# Patient Record
Sex: Female | Born: 1975 | Race: Black or African American | Hispanic: No | Marital: Married | Smoking: Never smoker
Health system: Southern US, Community
[De-identification: ages and names within clinical notes are randomized; demographics above are authoritative.]

## PROBLEM LIST (undated history)

## (undated) DIAGNOSIS — I10 Essential (primary) hypertension: Secondary | ICD-10-CM

## (undated) HISTORY — PX: TUBAL LIGATION: SHX77

---

## 2015-06-05 DIAGNOSIS — I10 Essential (primary) hypertension: Secondary | ICD-10-CM | POA: Insufficient documentation

## 2015-09-21 DIAGNOSIS — G5603 Carpal tunnel syndrome, bilateral upper limbs: Secondary | ICD-10-CM | POA: Insufficient documentation

## 2016-10-19 ENCOUNTER — Emergency Department (HOSPITAL_COMMUNITY): Payer: BC Managed Care – PPO

## 2016-10-19 ENCOUNTER — Encounter (HOSPITAL_COMMUNITY): Payer: Self-pay | Admitting: Emergency Medicine

## 2016-10-19 ENCOUNTER — Emergency Department (HOSPITAL_COMMUNITY)
Admission: EM | Admit: 2016-10-19 | Discharge: 2016-10-19 | Disposition: A | Discharge status: Home / Self Care | Payer: BC Managed Care – PPO | Attending: Physician Assistant | Admitting: Physician Assistant

## 2016-10-19 DIAGNOSIS — I10 Essential (primary) hypertension: Secondary | ICD-10-CM | POA: Insufficient documentation

## 2016-10-19 DIAGNOSIS — S80212A Abrasion, left knee, initial encounter: Secondary | ICD-10-CM | POA: Insufficient documentation

## 2016-10-19 DIAGNOSIS — M25562 Pain in left knee: Secondary | ICD-10-CM

## 2016-10-19 DIAGNOSIS — S80211A Abrasion, right knee, initial encounter: Secondary | ICD-10-CM | POA: Insufficient documentation

## 2016-10-19 DIAGNOSIS — W19XXXA Unspecified fall, initial encounter: Secondary | ICD-10-CM

## 2016-10-19 DIAGNOSIS — Y929 Unspecified place or not applicable: Secondary | ICD-10-CM | POA: Insufficient documentation

## 2016-10-19 DIAGNOSIS — W108XXA Fall (on) (from) other stairs and steps, initial encounter: Secondary | ICD-10-CM | POA: Diagnosis not present

## 2016-10-19 DIAGNOSIS — Y999 Unspecified external cause status: Secondary | ICD-10-CM | POA: Insufficient documentation

## 2016-10-19 DIAGNOSIS — Y939 Activity, unspecified: Secondary | ICD-10-CM | POA: Insufficient documentation

## 2016-10-19 DIAGNOSIS — M25561 Pain in right knee: Secondary | ICD-10-CM

## 2016-10-19 DIAGNOSIS — S8991XA Unspecified injury of right lower leg, initial encounter: Secondary | ICD-10-CM | POA: Diagnosis present

## 2016-10-19 HISTORY — DX: Essential (primary) hypertension: I10

## 2016-10-19 NOTE — ED Provider Notes (Signed)
[facility]-EMERGENCY DEPT Provider Note   CSN: [id number] Arrival date & time: 10/19/16  1216  By signing my name below, I, Rosario AdieWilliam Andrew Hiatt, attest that this documentation has been prepared under the direction and in the presence of [facility] HospitalEmily Kharma Sampsel, PA-C.  Electronically Signed: Rosario AdieWilliam Andrew Hiatt, ED Scribe. 10/19/16. 1:45 PM.  History   Chief Complaint Chief Complaint  Patient presents with  . Fall  . Knee Pain   The history is provided by the patient. No language interpreter was used.    HPI Comments: Kaylee May is a 41 y.o. female with a h/o HTN, who presents to the Emergency Department complaining of sudden onset, persistent bilateral knee pain beginning prior to arrival. Per pt, she was walking up a set of concrete steps when she suddenly fell into a kneeling position without tripping. She denies chest pain, shortness of breath, syncope, light-headedness, dizziness, or back pain preceding her fall. No LOC or head injury. She reports that she laid down on the ground until police and EMS arrived d/t her pain, and she denies any numbness/weakness. Pt has been ambulatory since this incident but she reports that this has been somewhat difficult secondary to this worsening her pain. She also sustained small abrasions overlying the bilateral knees. No noted treatments for her pain were tried prior to coming into the ED. No other associated symptoms or complaints at this time. Tetanus is UTD.   Past Medical History:  Diagnosis Date  . Hypertension    There are no active problems to display for this patient.  Past Surgical History:  Procedure Laterality Date  . TUBAL LIGATION     OB History    No data available     Home Medications    Prior to Admission medications   Not on File   Family History No family history on file.  Social History Social History  Substance Use Topics  . Smoking status: Never Smoker  . Smokeless tobacco: Not on file  . Alcohol use No   Allergies     Patient has no known allergies.  Review of Systems Review of Systems  Respiratory: Negative for shortness of breath.   Cardiovascular: Negative for chest pain.  Musculoskeletal: Positive for arthralgias and myalgias. Negative for back pain.  Skin: Positive for wound.  Neurological: Negative for dizziness, syncope, weakness, light-headedness and numbness.   Physical Exam Updated Vital Signs BP (!) 128/91   Pulse 73   Temp 98.4 F (36.9 C) (Oral)   Resp 14   SpO2 98%   Physical Exam  Constitutional: She appears well-developed and well-nourished. No distress.  HENT:  Head: Normocephalic and atraumatic.  Neck: Neck supple.  Pulmonary/Chest: Effort normal.  Musculoskeletal: She exhibits tenderness.  Some superficial abrasions to the bilateral knees with tenderness overlying abrasions and proximal tibia bilaterally. Compartments are soft. No other focal bony tenderness throughout lower legs. Spine nontender, no crepitus, or stepoffs. Lower extremities:  Strength 5/5, sensation intact, distal pulses intact.     Neurological: She is alert.  Skin: She is not diaphoretic.  Nursing note and vitals reviewed.  ED Treatments / Results  DIAGNOSTIC STUDIES: Oxygen Saturation is 100% on RA, normal by my interpretation.   COORDINATION OF CARE: 1:44 PM-Discussed next steps with pt. Pt verbalized understanding and is agreeable with the plan.   Labs (all labs ordered are listed, but only abnormal results are displayed) Labs Reviewed - No data to display  EKG  EKG Interpretation None      Radiology  Dg Knee Complete 4 Views Left  Result Date: 10/19/2016 CLINICAL DATA:  Pain following fall EXAM: LEFT KNEE - COMPLETE 4+ VIEW COMPARISON:  None. FINDINGS: Frontal, lateral, and bilateral oblique views were obtained. There is no fracture or dislocation. No joint effusion. Joint spaces appear normal. No erosive change. IMPRESSION: No fracture or dislocation. No joint effusion. No appreciable  arthropathy. Electronically Signed   By: Bretta Bang III M.D.   On: 10/19/2016 14:17   Dg Knee Complete 4 Views Right  Result Date: 10/19/2016 CLINICAL DATA:  Larey Seat on concrete this morning. Bilateral anterior knee pain. Initial encounter. EXAM: RIGHT KNEE - COMPLETE 4+ VIEW COMPARISON:  None. FINDINGS: No evidence of fracture, dislocation, or joint effusion. No evidence of arthropathy or other focal bone abnormality. Soft tissues are unremarkable. IMPRESSION: Negative. Electronically Signed   By: Sebastian Ache M.D.   On: 10/19/2016 14:17    Procedures Procedures  Medications Ordered in ED Medications - No data to display  Initial Impression / Assessment and Plan / ED Course  I have reviewed the triage vital signs and the nursing notes.  Pertinent labs & imaging results that were available during my care of the patient were reviewed by me and considered in my medical decision making (see chart for details).     Afebrile, nontoxic patient with bilateral knee contusions after falling on the stairs walking into her job.  No current neurologic deficits.  No syncope.   D/C home with bilateral knee sleeves.  Pt declines crutches, medications.  Discussed result, findings, treatment, and follow up  with patient.  Pt given return precautions.  Pt verbalizes understanding and agrees with plan.       Final Clinical Impressions(s) / ED Diagnoses   Final diagnoses:  Acute pain of both knees  Fall, initial encounter   New Prescriptions There are no discharge medications for this patient.  I personally performed the services described in this documentation, which was scribed in my presence. The recorded information has been reviewed and is accurate.     Trixie Dredge, PA-C 10/19/16 1627    Abelino Derrick, MD 10/20/16 [phone]

## 2016-10-19 NOTE — Discharge Instructions (Signed)
Read the information below.  You may return to the Emergency Department at any time for worsening condition or any new symptoms that concern you.   If you develop uncontrolled pain, weakness or numbness of the extremity, severe discoloration of the skin, or you are unable to walk or move your knees, return to the ER for a recheck.

## 2016-10-19 NOTE — ED Triage Notes (Signed)
Pt c/o bilateral knee pain onset today after mechanical fall and landing on knees today. No head injury or LOC.

## 2017-11-26 IMAGING — CR DG KNEE COMPLETE 4+V*L*
4 series · 4 of 4 positions shown · non-contrast
Comparison: None.

CLINICAL DATA: Pain following fall

EXAM:
LEFT KNEE - COMPLETE 4+ VIEW

[t knee ap left]
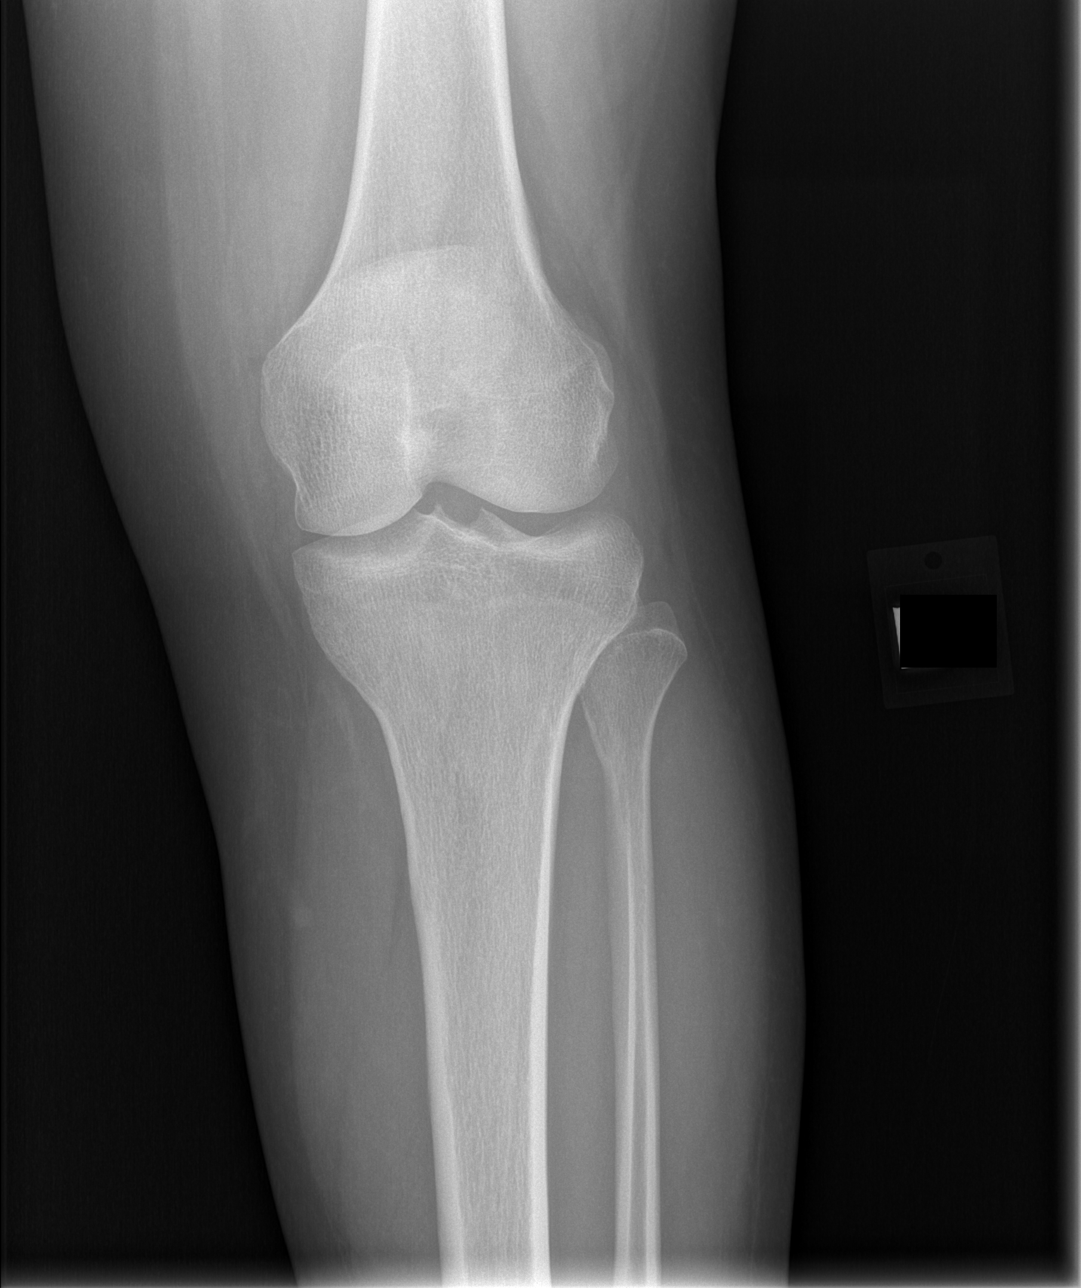

[t knee obl left (1 of 2)]
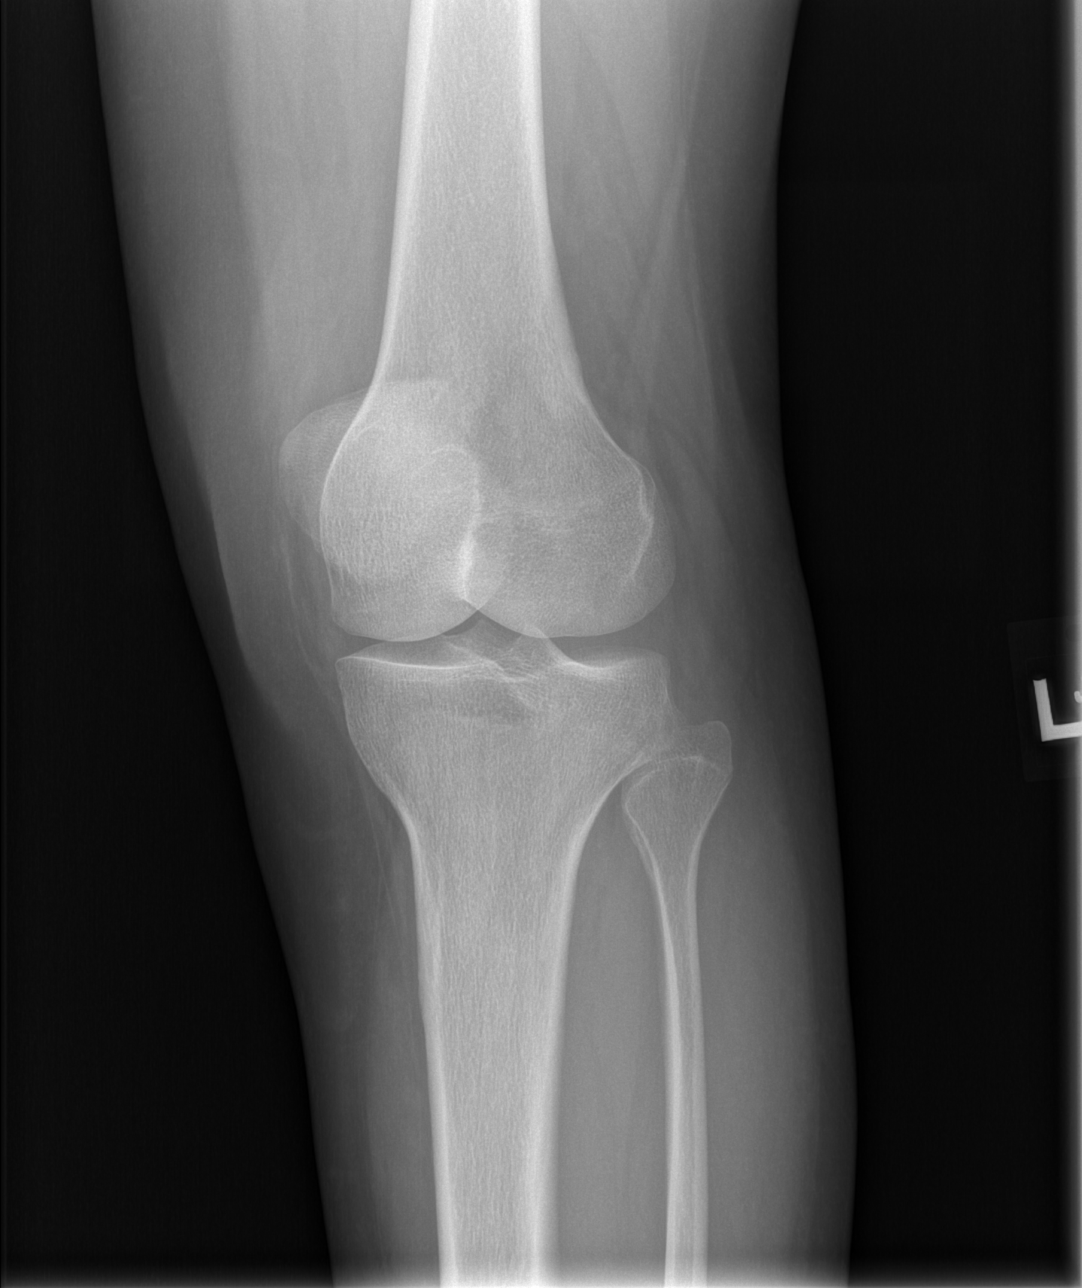

[t knee obl left (2 of 2)]
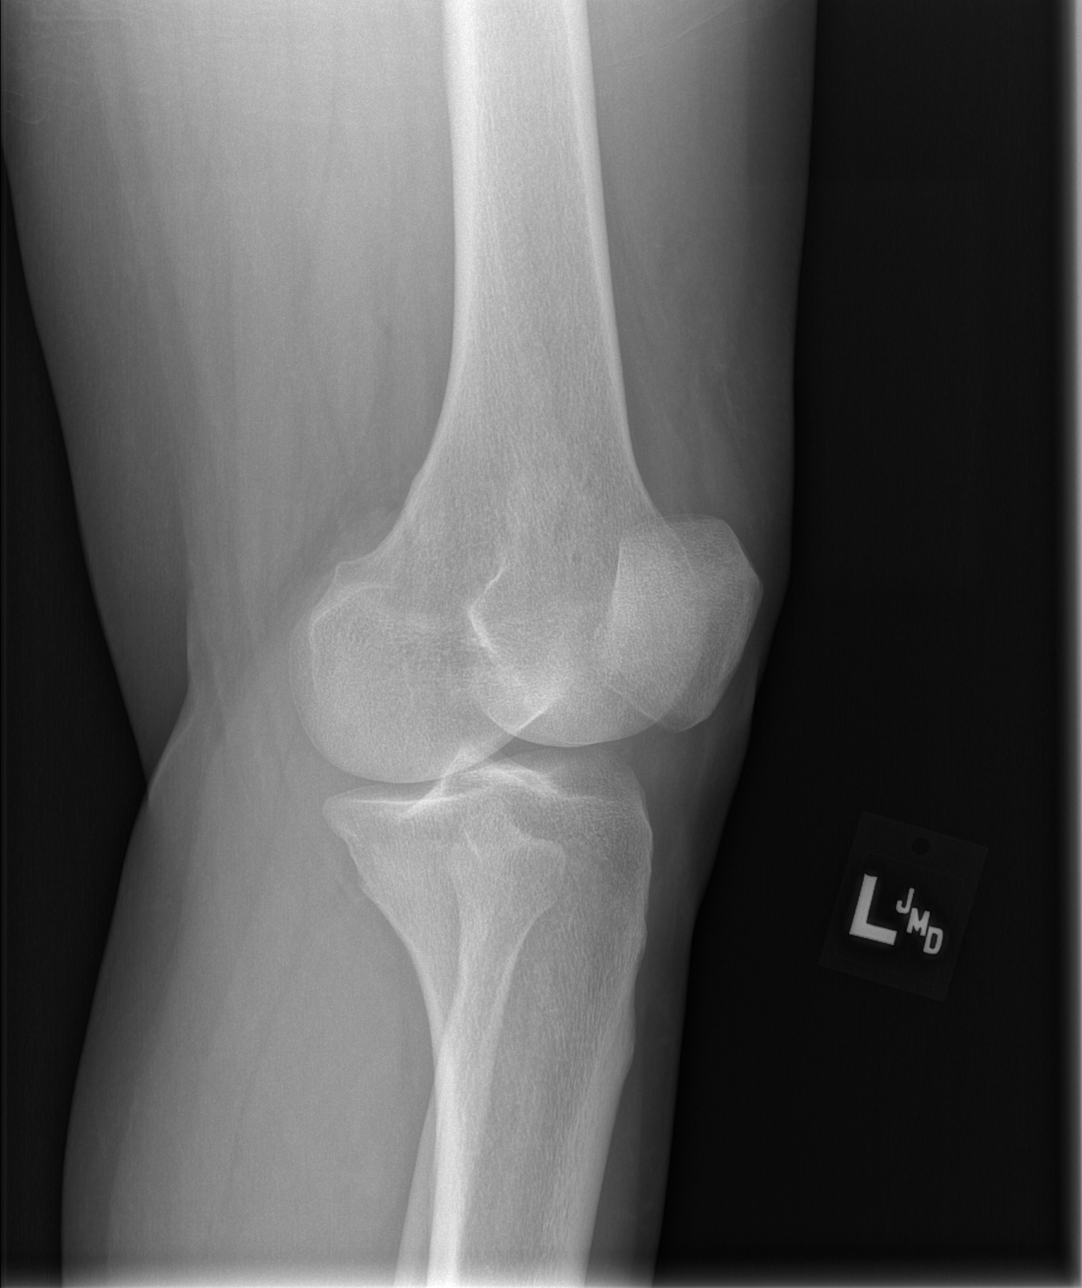

[t knee lat left]
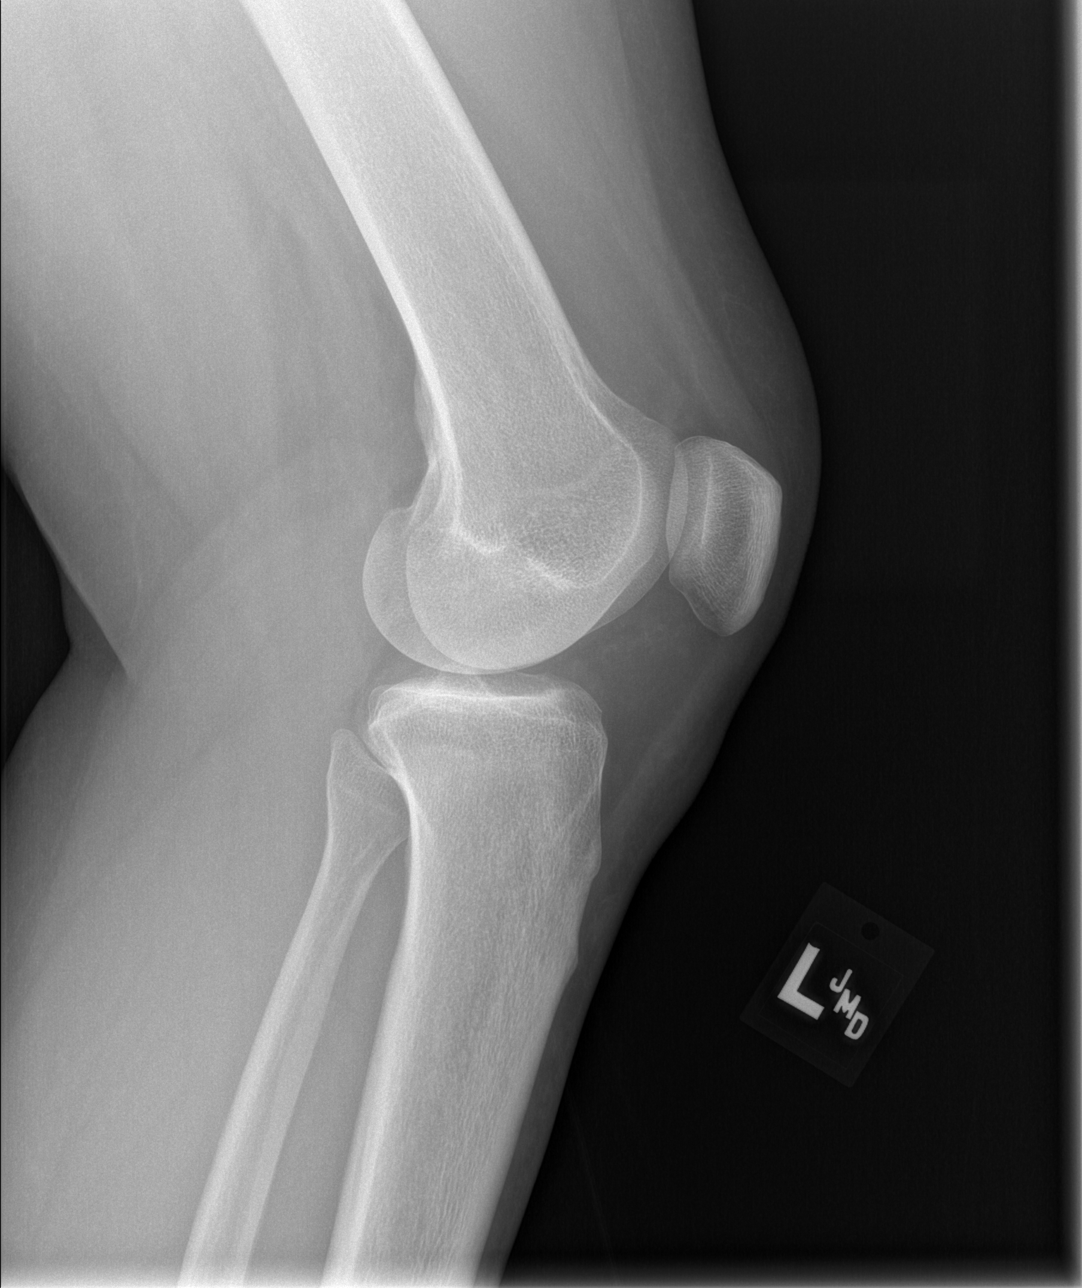

[4 of 4 positions shown; findings below may reference images not displayed]

FINDINGS: Frontal, lateral, and bilateral oblique views were obtained. There
is no fracture or dislocation. No joint effusion. Joint spaces
appear normal. No erosive change.
IMPRESSION: No fracture or dislocation. No joint effusion. No appreciable
arthropathy.

## 2020-05-28 ENCOUNTER — Ambulatory Visit: Payer: BC Managed Care – PPO | Attending: Internal Medicine

## 2020-05-28 DIAGNOSIS — Z23 Encounter for immunization: Secondary | ICD-10-CM

## 2020-05-28 NOTE — Progress Notes (Signed)
   Covid-19 Vaccination Clinic  Name:  Ayline Dingus    MRN: [id number] DOB: 1975/08/29  05/28/2020  Ms. Landen was observed post Covid-19 immunization for 15 minutes without incident. She was provided with Vaccine Information Sheet and instruction to access the V-Safe system.   Ms. Mette was instructed to call 911 with any severe reactions post vaccine: Marland Kitchen Difficulty breathing  . Swelling of face and throat  . A fast heartbeat  . A bad rash all over body  . Dizziness and weakness   Immunizations Administered    Name Date Dose VIS Date Route   Moderna Covid-19 Booster Vaccine 05/28/2020  5:47 PM 0.25 mL 04/01/2020 Intramuscular   Manufacturer: Moderna   Lot: [id number]   NDC: 33545-625-63

## 2021-02-17 ENCOUNTER — Ambulatory Visit: Payer: BC Managed Care – PPO | Admitting: Internal Medicine

## 2021-03-04 NOTE — Progress Notes (Signed)
Subjective:    Patient ID: Kaylee May, female    DOB: Feb 18, 1976, 45 y.o.   MRN: [id number]   This visit occurred during the SARS-CoV-2 public health emergency.  Safety protocols were in place, including screening questions prior to the visit, additional usage of staff PPE, and extensive cleaning of exam room while observing appropriate contact time as indicated for disinfecting solutions.    HPI She is here to establish with a new pcp.  She is here for a physical exam.   She has no concerns.     Medications and allergies reviewed with patient and updated if appropriate.  Patient Active Problem List   Diagnosis Date Noted   Carpal tunnel syndrome, bilateral 09/21/2015   Essential hypertension 06/05/2015    Current Outpatient Medications on File Prior to Visit  Medication Sig Dispense Refill   amLODipine (NORVASC) 10 MG tablet Take 10 mg by mouth daily.     triamterene-hydrochlorothiazide (DYAZIDE) 37.5-25 MG capsule Take 1 capsule by mouth daily.     No current facility-administered medications on file prior to visit.    Past Medical History:  Diagnosis Date   Hypertension     Past Surgical History:  Procedure Laterality Date   TUBAL LIGATION      Social History   Socioeconomic History   Marital status: Married    Spouse name: Not on file   Number of children: Not on file   Years of education: Not on file   Highest education level: Not on file  Occupational History   Not on file  Tobacco Use   Smoking status: Never   Smokeless tobacco: Never  Substance and Sexual Activity   Alcohol use: No   Drug use: No   Sexual activity: Not on file  Other Topics Concern   Not on file  Social History Narrative   Not on file   Social Determinants of Health   Financial Resource Strain: Not on file  Food Insecurity: Not on file  Transportation Needs: Not on file  Physical Activity: Not on file  Stress: Not on file  Social Connections: Not on file     Family History  Problem Relation Age of Onset   Hypertension Mother    Hypertension Father    Hypertension Sister    Hypertension Brother     Review of Systems  Constitutional:  Negative for chills and fever.  Eyes:  Negative for visual disturbance.  Respiratory:  Negative for cough, shortness of breath and wheezing.   Cardiovascular:  Positive for leg swelling (minimal occ). Negative for chest pain and palpitations.  Gastrointestinal:  Negative for abdominal pain, blood in stool, constipation, diarrhea and nausea.       No gerd  Genitourinary:  Negative for dysuria and hematuria.  Musculoskeletal:  Positive for back pain (occ - when she drinks soda). Negative for arthralgias.  Skin:  Negative for color change and rash.  Neurological:  Positive for headaches (occ - menstraul, with contacts). Negative for dizziness and light-headedness.  Psychiatric/Behavioral:  Negative for dysphoric mood. The patient is not nervous/anxious.       Objective:   Vitals:   03/05/21 1259  BP: 132/78  Pulse: 87  Temp: 98.4 F (36.9 C)  SpO2: 99%   Filed Weights   03/05/21 1259  Weight: 190 lb (86.2 kg)   Body mass index is 31.62 kg/m.  BP Readings from Last 3 Encounters:  03/05/21 132/78  10/19/16 (!) 128/91    Wt Readings from  Last 3 Encounters:  03/05/21 190 lb (86.2 kg)    Depression screen [facility] 2/9 03/05/2021  Decreased Interest 0  Down, Depressed, Hopeless 0  PHQ - 2 Score 0  Altered sleeping 0  Tired, decreased energy 0  Change in appetite 0  Feeling bad or failure about yourself  0  Trouble concentrating 0  Moving slowly or fidgety/restless 0  Suicidal thoughts 0  PHQ-9 Score 0    GAD 7 : Generalized Anxiety Score 03/05/2021  Nervous, Anxious, on Edge 0  Control/stop worrying 0  Worry too much - different things 0  Trouble relaxing 0  Restless 0  Easily annoyed or irritable 0  Afraid - awful might happen 0  Total GAD 7 Score 0       Physical  Exam Constitutional: She appears well-developed and well-nourished. No distress.  HENT:  Head: Normocephalic and atraumatic.  Right Ear: External ear normal. Normal ear canal and TM Left Ear: External ear normal.  Normal ear canal and TM Mouth/Throat: Oropharynx is clear and moist.  Eyes: Conjunctivae and EOM are normal.  Neck: Neck supple. No tracheal deviation present. No thyromegaly present.  No carotid bruit  Cardiovascular: Normal rate, regular rhythm and normal heart sounds.   No murmur heard.  No edema. Pulmonary/Chest: Effort normal and breath sounds normal. No respiratory distress. She has no wheezes. She has no rales.  Breast: deferred   Abdominal: Soft. She exhibits no distension. There is no tenderness.  Lymphadenopathy: She has no cervical adenopathy.  Skin: Skin is warm and dry. She is not diaphoretic.  Psychiatric: She has a normal mood and affect. Her behavior is normal.     No results found for: WBC, HGB, HCT, PLT, GLUCOSE, CHOL, TRIG, HDL, LDLDIRECT, LDLCALC, ALT, AST, NA, K, CL, CREATININE, BUN, CO2, TSH, PSA, INR, GLUF, HGBA1C, MICROALBUR       Assessment & Plan:   Physical exam: Screening blood work  ordered Exercise  does a lot of walking Weight  ok Substance abuse  none   Screened for depression using the PHQ 9 scale.  No evidence of depression.   Screened for anxiety using GAD7 Scale.  No evidence of anxiety.   Reviewed recommended immunizations.   Health Maintenance  Topic Date Due   MAMMOGRAM  Never done   PAP SMEAR-Modifier  Never done   COLONOSCOPY (Pts 45-25yrs Insurance coverage will need to be confirmed)  Never done   COVID-19 Vaccine (2 - Moderna series) 06/25/2020   INFLUENZA VACCINE  09/10/2021 (Originally 01/11/2021)   TETANUS/TDAP  06/04/2025   HPV VACCINES  Aged Out   Hepatitis C Screening  Discontinued   HIV Screening  Discontinued          See Problem List for Assessment and Plan of chronic medical problems.

## 2021-03-05 ENCOUNTER — Other Ambulatory Visit: Payer: Self-pay

## 2021-03-05 ENCOUNTER — Ambulatory Visit: Payer: Self-pay | Admitting: Internal Medicine

## 2021-03-05 ENCOUNTER — Encounter: Payer: Self-pay | Admitting: Internal Medicine

## 2021-03-05 VITALS — BP 132/78 | HR 87 | Temp 98.4°F | Ht 65.0 in | Wt 190.0 lb

## 2021-03-05 DIAGNOSIS — Z0000 Encounter for general adult medical examination without abnormal findings: Secondary | ICD-10-CM | POA: Diagnosis not present

## 2021-03-05 DIAGNOSIS — Z1211 Encounter for screening for malignant neoplasm of colon: Secondary | ICD-10-CM

## 2021-03-05 DIAGNOSIS — Z1331 Encounter for screening for depression: Secondary | ICD-10-CM | POA: Diagnosis not present

## 2021-03-05 DIAGNOSIS — I10 Essential (primary) hypertension: Secondary | ICD-10-CM | POA: Diagnosis not present

## 2021-03-05 LAB — CBC WITH DIFFERENTIAL/PLATELET
Basophils Absolute: 0.1 10*3/uL (ref 0.0–0.1)
Basophils Relative: 1.1 % (ref 0.0–3.0)
Eosinophils Absolute: 0.1 10*3/uL (ref 0.0–0.7)
Eosinophils Relative: 2 % (ref 0.0–5.0)
HCT: 40.2 % (ref 36.0–46.0)
Hemoglobin: 13.4 g/dL (ref 12.0–15.0)
Lymphocytes Relative: 26.2 % (ref 12.0–46.0)
Lymphs Abs: 1.7 10*3/uL (ref 0.7–4.0)
MCHC: 33.4 g/dL (ref 30.0–36.0)
MCV: 95.8 fl (ref 78.0–100.0)
Monocytes Absolute: 0.6 10*3/uL (ref 0.1–1.0)
Monocytes Relative: 9 % (ref 3.0–12.0)
Neutro Abs: 3.9 10*3/uL (ref 1.4–7.7)
Neutrophils Relative %: 61.7 % (ref 43.0–77.0)
Platelets: 342 10*3/uL (ref 150.0–400.0)
RBC: 4.2 Mil/uL (ref 3.87–5.11)
RDW: 14.9 % (ref 11.5–15.5)
WBC: 6.3 10*3/uL (ref 4.0–10.5)

## 2021-03-05 LAB — COMPREHENSIVE METABOLIC PANEL
ALT: 13 U/L (ref 0–35)
AST: 17 U/L (ref 0–37)
Albumin: 4.6 g/dL (ref 3.5–5.2)
Alkaline Phosphatase: 66 U/L (ref 39–117)
BUN: 10 mg/dL (ref 6–23)
CO2: 31 mEq/L (ref 19–32)
Calcium: 10.1 mg/dL (ref 8.4–10.5)
Chloride: 98 mEq/L (ref 96–112)
Creatinine, Ser: 0.78 mg/dL (ref 0.40–1.20)
GFR: 91.29 mL/min (ref >60.00)
Glucose, Bld: 83 mg/dL (ref 70–99)
Potassium: 3.6 mEq/L (ref 3.5–5.1)
Sodium: 138 mEq/L (ref 135–145)
Total Bilirubin: 0.6 mg/dL (ref 0.2–1.2)
Total Protein: 8.4 g/dL — ABNORMAL HIGH (ref 6.0–8.3)

## 2021-03-05 LAB — LIPID PANEL
Cholesterol: 208 mg/dL — ABNORMAL HIGH (ref 0–200)
HDL: 43.7 mg/dL (ref >39.00)
LDL Cholesterol: 144 mg/dL — ABNORMAL HIGH (ref 0–99)
NonHDL: 164.68
Total CHOL/HDL Ratio: 5
Triglycerides: 105 mg/dL (ref 0.0–149.0)
VLDL: 21 mg/dL (ref 0.0–40.0)

## 2021-03-05 LAB — TSH: TSH: 0.4 u[IU]/mL (ref 0.35–5.50)

## 2021-03-05 NOTE — Patient Instructions (Addendum)
Blood work was ordered.     Medications changes include :   none    Please followup in 1 year   Health Maintenance, Female Adopting a healthy lifestyle and getting preventive care are important in promoting health and wellness. Ask your health care provider about: The right schedule for you to have regular tests and exams. Things you can do on your own to prevent diseases and keep yourself healthy. What should I know about diet, weight, and exercise? Eat a healthy diet  Eat a diet that includes plenty of vegetables, fruits, low-fat dairy products, and lean protein. Do not eat a lot of foods that are high in solid fats, added sugars, or sodium. Maintain a healthy weight Body mass index (BMI) is used to identify weight problems. It estimates body fat based on height and weight. Your health care provider can help determine your BMI and help you achieve or maintain a healthy weight. Get regular exercise Get regular exercise. This is one of the most important things you can do for your health. Most adults should: Exercise for at least 150 minutes each week. The exercise should increase your heart rate and make you sweat (moderate-intensity exercise). Do strengthening exercises at least twice a week. This is in addition to the moderate-intensity exercise. Spend less time sitting. Even light physical activity can be beneficial. Watch cholesterol and blood lipids Have your blood tested for lipids and cholesterol at 46 years of age, then have this test every 5 years. Have your cholesterol levels checked more often if: Your lipid or cholesterol levels are high. You are older than 46 years of age. You are at high risk for heart disease. What should I know about cancer screening? Depending on your health history and family history, you may need to have cancer screening at various ages. This may include screening for: Breast cancer. Cervical cancer. Colorectal cancer. Skin cancer. Lung  cancer. What should I know about heart disease, diabetes, and high blood pressure? Blood pressure and heart disease High blood pressure causes heart disease and increases the risk of stroke. This is more likely to develop in people who have high blood pressure readings, are of African descent, or are overweight. Have your blood pressure checked: Every 3-5 years if you are 18-39 years of age. Every year if you are 40 years old or older. Diabetes Have regular diabetes screenings. This checks your fasting blood sugar level. Have the screening done: Once every three years after age 40 if you are at a normal weight and have a low risk for diabetes. More often and at a younger age if you are overweight or have a high risk for diabetes. What should I know about preventing infection? Hepatitis B If you have a higher risk for hepatitis B, you should be screened for this virus. Talk with your health care provider to find out if you are at risk for hepatitis B infection. Hepatitis C Testing is recommended for: Everyone born from 1945 through 1965. Anyone with known risk factors for hepatitis C. Sexually transmitted infections (STIs) Get screened for STIs, including gonorrhea and chlamydia, if: You are sexually active and are younger than 46 years of age. You are older than 46 years of age and your health care provider tells you that you are at risk for this type of infection. Your sexual activity has changed since you were last screened, and you are at increased risk for chlamydia or gonorrhea. Ask your health care provider if you are   at risk. Ask your health care provider about whether you are at high risk for HIV. Your health care provider may recommend a prescription medicine to help prevent HIV infection. If you choose to take medicine to prevent HIV, you should first get tested for HIV. You should then be tested every 3 months for as long as you are taking the medicine. Pregnancy If you are about  to stop having your period (premenopausal) and you may become pregnant, seek counseling before you get pregnant. Take 400 to 800 micrograms (mcg) of folic acid every day if you become pregnant. Ask for birth control (contraception) if you want to prevent pregnancy. Osteoporosis and menopause Osteoporosis is a disease in which the bones lose minerals and strength with aging. This can result in bone fractures. If you are 65 years old or older, or if you are at risk for osteoporosis and fractures, ask your health care provider if you should: Be screened for bone loss. Take a calcium or vitamin D supplement to lower your risk of fractures. Be given hormone replacement therapy (HRT) to treat symptoms of menopause. Follow these instructions at home: Lifestyle Do not use any products that contain nicotine or tobacco, such as cigarettes, e-cigarettes, and chewing tobacco. If you need help quitting, ask your health care provider. Do not use street drugs. Do not share needles. Ask your health care provider for help if you need support or information about quitting drugs. Alcohol use Do not drink alcohol if: Your health care provider tells you not to drink. You are pregnant, may be pregnant, or are planning to become pregnant. If you drink alcohol: Limit how much you use to 0-1 drink a day. Limit intake if you are breastfeeding. Be aware of how much alcohol is in your drink. In the [place] one drink equals one 12 oz bottle of beer (355 mL), one 5 oz glass of wine (148 mL), or one 1 oz glass of hard liquor (44 mL). General instructions Schedule regular health, dental, and eye exams. Stay current with your vaccines. Tell your health care provider if: You often feel depressed. You have ever been abused or do not feel safe at home. Summary Adopting a healthy lifestyle and getting preventive care are important in promoting health and wellness. Follow your health care provider's instructions about  healthy diet, exercising, and getting tested or screened for diseases. Follow your health care provider's instructions on monitoring your cholesterol and blood pressure. This information is not intended to replace advice given to you by your health care provider. Make sure you discuss any questions you have with your health care provider. Document Revised: 08/07/2020 Document Reviewed: 05/23/2018 Elsevier Patient Education  2022 Elsevier Inc.  

## 2021-03-05 NOTE — Assessment & Plan Note (Signed)
Chronic BP well controlled Continue amlodipine 10 mg daily, dyazide 37.5-25 mg daily cmp

## 2021-07-23 ENCOUNTER — Encounter: Payer: Self-pay | Admitting: Gastroenterology

## 2021-08-26 ENCOUNTER — Encounter: Payer: BC Managed Care – PPO | Admitting: Gastroenterology

## 2021-08-30 ENCOUNTER — Telehealth: Payer: Self-pay | Admitting: *Deleted

## 2021-08-30 NOTE — Telephone Encounter (Signed)
Pt.called back and rescheduled pre-visit for 08/31/21 ?

## 2021-08-30 NOTE — Telephone Encounter (Signed)
ATTEMPTED TO CONTACT X2,UNABLE TO LEAVE MESSAGE MAILBOX FULL. ?

## 2021-08-31 ENCOUNTER — Ambulatory Visit (AMBULATORY_SURGERY_CENTER): Payer: BC Managed Care – PPO | Admitting: *Deleted

## 2021-08-31 ENCOUNTER — Other Ambulatory Visit: Payer: Self-pay

## 2021-08-31 ENCOUNTER — Encounter: Payer: Self-pay | Admitting: Gastroenterology

## 2021-08-31 VITALS — Ht 65.0 in | Wt 190.0 lb

## 2021-08-31 DIAGNOSIS — Z1211 Encounter for screening for malignant neoplasm of colon: Secondary | ICD-10-CM

## 2021-08-31 NOTE — Progress Notes (Signed)
No egg or soy allergy known to patient  ?No issues known to pt with past sedation with any surgeries or procedures ?Patient denies ever being told they had issues or difficulty with intubation  ?No FH of Malignant Hyperthermia ?Pt is not on diet pills ?Pt is not on  home 02  ?Pt is not on blood thinners  ?Pt denies issues with constipation  ?No A fib or A flutter ? ?Pt is  vaccinated  for Covid  ? ?Due to the COVID-19 pandemic we are asking patients to follow certain guidelines in PV and the [place]   ?Pt aware of COVID protocols and [facility] guidelines  ? ?PV completed over the phone. Pt verified name, DOB, address and insurance during PV today.  ?Pt mailed instruction packet with copy of consent form to read and not return, and instructions.  ?Pt encouraged to call with questions or issues.  ?If pt has My chart, procedure instructions sent via My Chart   ? ?Sample sheet of over the counter items to purchase for prep sent went packet. ?

## 2021-09-13 ENCOUNTER — Encounter: Payer: Self-pay | Admitting: Gastroenterology

## 2021-09-13 ENCOUNTER — Ambulatory Visit (AMBULATORY_SURGERY_CENTER): Payer: BC Managed Care – PPO | Admitting: Gastroenterology

## 2021-09-13 VITALS — BP 125/87 | HR 66 | Temp 97.3°F | Resp 16 | Ht 65.0 in | Wt 190.0 lb

## 2021-09-13 VITALS — BP 114/79 | HR 64 | Temp 97.7°F | Resp 14 | Ht 65.0 in | Wt 190.0 lb

## 2021-09-13 DIAGNOSIS — Z538 Procedure and treatment not carried out for other reasons: Secondary | ICD-10-CM

## 2021-09-13 DIAGNOSIS — Z1211 Encounter for screening for malignant neoplasm of colon: Secondary | ICD-10-CM

## 2021-09-13 MED ORDER — SODIUM CHLORIDE 0.9 % IV SOLN: 500.0000 mL | Freq: Once | INTRAVENOUS | Status: DC

## 2021-09-13 NOTE — Op Note (Signed)
[facility] ?Patient Name: Kaylee May ?Procedure Date: 09/13/2021 8:59 AM ?MRN: [id number] ?Endoscopist: Tressia Danas MD, MD ?Age: 46 ?Referring MD:  ?Date of Birth: [birth year] ?Gender: Female ?Account #: [id number] ?Procedure:                Colonoscopy ?Indications:              Screening for colorectal malignant neoplasm, This  ?                          is the patient's first colonoscopy ?                          No known family history of colon cancer or polyps ?Medicines:                Monitored Anesthesia Care ?Procedure:                Pre-Anesthesia Assessment: ?                          - Prior to the procedure, a History and Physical  ?                          was performed, and patient medications and  ?                          allergies were reviewed. The patient's tolerance of  ?                          previous anesthesia was also reviewed. The risks  ?                          and benefits of the procedure and the sedation  ?                          options and risks were discussed with the patient.  ?                          All questions were answered, and informed consent  ?                          was obtained. Prior Anticoagulants: The patient has  ?                          taken no previous anticoagulant or antiplatelet  ?                          agents. ASA Grade Assessment: II - A patient with  ?                          mild systemic disease. After reviewing the risks  ?                          and benefits, the patient was deemed in  ?  satisfactory condition to undergo the procedure. ?                          After obtaining informed consent, the colonoscope  ?                          was passed under direct vision. Throughout the  ?                          procedure, the patient's blood pressure, pulse, and  ?                          oxygen saturations were monitored continuously. The  ?                          Olympus CF-HQ190L  ([id number]) Colonoscope was  ?                          introduced through the anus with the intention of  ?                          advancing to the ileum. The scope was advanced to  ?                          the sigmoid colon before the procedure was aborted.  ?                          Medications were given. The colonoscopy was  ?                          technically difficult and complex due to inadequate  ?                          bowel prep. The patient tolerated the procedure  ?                          well. The quality of the bowel preparation was  ?                          inadequate. The rectum was photographed. ?Scope In: 9:08:23 AM ?Scope Out: 9:11:04 AM ?Total Procedure Duration: 0 hours 2 minutes 41 seconds  ?Findings:                 The perianal and digital rectal examinations were  ?                          normal. ?                          A moderate amount of liquid semi-liquid semi-solid  ?                          stool was found in the entire colon, interfering  ?  with visualization. ?Complications:            No immediate complications. ?Estimated Blood Loss:     Estimated blood loss: none. ?Impression:               - Preparation of the colon was inadequate. ?                          - Stool in the entire examined colon. ?                          - No specimens collected. ?Recommendation:           - Patient has a contact number available for  ?                          emergencies. The signs and symptoms of potential  ?                          delayed complications were discussed with the  ?                          patient. Return to normal activities tomorrow.  ?                          Written discharge instructions were provided to the  ?                          patient. ?                          - Resume previous diet. ?                          - Continue present medications. ?                          - Repeat colonoscopy at the next available  ?                           appointment because the bowel preparation was poor.  ?                          Plan two day bowel prep at that time. ?                          - Emerging evidence supports eating a diet of  ?                          fruits, vegetables, grains, calcium, and yogurt  ?                          while reducing red meat and alcohol may reduce the  ?                          risk of colon cancer. ?Tressia Danas MD, MD ?09/13/2021 9:16:42 AM ?This report  has been signed electronically. ?

## 2021-09-13 NOTE — Patient Instructions (Signed)
Handout given. Resume diet and medications. ? ?YOU HAD AN ENDOSCOPIC PROCEDURE TODAY AT THE [facility]:   Refer to the procedure report that was given to you for any specific questions about what was found during the examination.  If the procedure report does not answer your questions, please call your gastroenterologist to clarify.  If you requested that your care partner not be given the details of your procedure findings, then the procedure report has been included in a sealed envelope for you to review at your convenience later. ? ?YOU SHOULD EXPECT: Some feelings of bloating in the abdomen. Passage of more gas than usual.  Walking can help get rid of the air that was put into your GI tract during the procedure and reduce the bloating. If you had a lower endoscopy (such as a colonoscopy or flexible sigmoidoscopy) you may notice spotting of blood in your stool or on the toilet paper. If you underwent a bowel prep for your procedure, you may not have a normal bowel movement for a few days. ? ?Please Note:  You might notice some irritation and congestion in your nose or some drainage.  This is from the oxygen used during your procedure.  There is no need for concern and it should clear up in a day or so. ? ?SYMPTOMS TO REPORT IMMEDIATELY: ? ?Following lower endoscopy (colonoscopy or flexible sigmoidoscopy): ? Excessive amounts of blood in the stool ? Significant tenderness or worsening of abdominal pains ? Swelling of the abdomen that is new, acute ? Fever of 100?F or higher ? ? ?For urgent or emergent issues, a gastroenterologist can be reached at any hour by calling [phone]. ?Do not use MyChart messaging for urgent concerns.  ? ? ?DIET:  We do recommend a small meal at first, but then you may proceed to your regular diet.  Drink plenty of fluids but you should avoid alcoholic beverages for 24 hours. ? ?ACTIVITY:  You should plan to take it easy for the rest of today and you should NOT DRIVE  or use heavy machinery until tomorrow (because of the sedation medicines used during the test).   ? ?FOLLOW UP: ?Our staff will call the number listed on your records 48-72 hours following your procedure to check on you and address any questions or concerns that you may have regarding the information given to you following your procedure. If we do not reach you, we will leave a message.  We will attempt to reach you two times.  During this call, we will ask if you have developed any symptoms of COVID 19. If you develop any symptoms (ie: fever, flu-like symptoms, shortness of breath, cough etc.) before then, please call [phone].  If you test positive for Covid 19 in the 2 weeks post procedure, please call and report this information to us.   ? ?If any biopsies were taken you will be contacted by phone or by letter within the next 1-3 weeks.  Please call us at [phone] if you have not heard about the biopsies in 3 weeks.  ? ? ?SIGNATURES/CONFIDENTIALITY: ?You and/or your care partner have signed paperwork which will be entered into your electronic medical record.  These signatures attest to the fact that that the information above on your After Visit Summary has been reviewed and is understood.  Full responsibility of the confidentiality of this discharge information lies with you and/or your care-partner.  ?

## 2021-09-13 NOTE — Progress Notes (Signed)
Per Rosann Auerbach and [place] pt to stay in recovery to prep for repeat colonoscopy today at 3:30 with Dr. Adela Lank. IV left in place. ?

## 2021-09-13 NOTE — Patient Instructions (Signed)
YOU HAD AN ENDOSCOPIC PROCEDURE TODAY AT THE [facility]:   Refer to the procedure report that was given to you for any specific questions about what was found during the examination.  If the procedure report does not answer your questions, please call your gastroenterologist to clarify.  If you requested that your care partner not be given the details of your procedure findings, then the procedure report has been included in a sealed envelope for you to review at your convenience later.  YOU SHOULD EXPECT: Some feelings of bloating in the abdomen. Passage of more gas than usual.  Walking can help get rid of the air that was put into your GI tract during the procedure and reduce the bloating. If you had a lower endoscopy (such as a colonoscopy or flexible sigmoidoscopy) you may notice spotting of blood in your stool or on the toilet paper. If you underwent a bowel prep for your procedure, you may not have a normal bowel movement for a few days.  Please Note:  You might notice some irritation and congestion in your nose or some drainage.  This is from the oxygen used during your procedure.  There is no need for concern and it should clear up in a day or so.  SYMPTOMS TO REPORT IMMEDIATELY:   Following lower endoscopy (colonoscopy or flexible sigmoidoscopy):  Excessive amounts of blood in the stool  Significant tenderness or worsening of abdominal pains  Swelling of the abdomen that is new, acute  Fever of 100F or higher  For urgent or emergent issues, a gastroenterologist can be reached at any hour by calling [phone]. Do not use MyChart messaging for urgent concerns.    DIET:  We do recommend a small meal at first, but then you may proceed to your regular diet.  Drink plenty of fluids but you should avoid alcoholic beverages for 24 hours.  ACTIVITY:  You should plan to take it easy for the rest of today and you should NOT DRIVE or use heavy machinery until tomorrow (because  of the sedation medicines used during the test).    FOLLOW UP: Our staff will call the number listed on your records 48-72 hours following your procedure to check on you and address any questions or concerns that you may have regarding the information given to you following your procedure. If we do not reach you, we will leave a message.  We will attempt to reach you two times.  During this call, we will ask if you have developed any symptoms of COVID 19. If you develop any symptoms (ie: fever, flu-like symptoms, shortness of breath, cough etc.) before then, please call [phone].  If you test positive for Covid 19 in the 2 weeks post procedure, please call and report this information to us.    If any biopsies were taken you will be contacted by phone or by letter within the next 1-3 weeks.  Please call us at [phone] if you have not heard about the biopsies in 3 weeks.    SIGNATURES/CONFIDENTIALITY: You and/or your care partner have signed paperwork which will be entered into your electronic medical record.  These signatures attest to the fact that that the information above on your After Visit Summary has been reviewed and is understood.  Full responsibility of the confidentiality of this discharge information lies with you and/or your care-partner. 

## 2021-09-13 NOTE — Progress Notes (Signed)
[facility] History and Physical ? ? ?Primary Care Physician:  Pincus Sanes, MD ? ? ?Reason for Procedure:   Colon cancer screening ? ?Plan:    colonoscopy ? ? ? ? ?HPI: Kaylee May is a 46 y.o. female  here for colonoscopy screening - first time exam. Had attempted colonoscopy this AM with Dr. Orvan Falconer and poor prep, procedure aborted. She drank more prep today in hopes of completing exam today. States her stools have cleared. Patient denies any bowel symptoms at this time otherwise. No family history of colon cancer known. Otherwise feels well without any cardiopulmonary symptoms.  ? ? ?Past Medical History:  ?Diagnosis Date  ? Hypertension   ? ? ?Past Surgical History:  ?Procedure Laterality Date  ? TUBAL LIGATION    ? ? ?Prior to Admission medications   ?Medication Sig Start Date End Date Taking? Authorizing Provider  ?amLODipine (NORVASC) 10 MG tablet Take 10 mg by mouth daily. 02/01/21   [provider]  ?BIOTIN PO Take by mouth daily. TAKE ONE CAPSULE THRE TIMES DAILY    [provider]  ?Cholecalciferol (VITAMIN D3 PO) Take by mouth daily. TAKE ONE GEL PILL DAILY    [provider]  ?Multiple Vitamin (MULTIVITAMIN PO) Take by mouth 2 (two) times daily. Take one in the A.M.,one in the P.M.    [provider]  ?OVER THE COUNTER MEDICATION 3 (three) times daily. CRANBERRY WITH MANNOSE TAKE ONE CAPSULE DAILY    [provider]  ?OVER THE COUNTER MEDICATION 3 (three) times daily. PROBOTIC TAKE ONE TABLET THREE TIMES A DAY    [provider]  ?Pyridoxine HCl (VITAMIN B-6 PO) Take by mouth daily. TAKE ONE TABLET    [provider]  ?triamterene-hydrochlorothiazide (DYAZIDE) 37.5-25 MG capsule Take 1 capsule by mouth daily. 02/01/21   [provider]  ? ? ?Current Outpatient Medications  ?Medication Sig Dispense Refill  ? amLODipine (NORVASC) 10 MG tablet Take 10 mg by mouth daily.    ? BIOTIN PO Take by mouth daily. TAKE ONE  CAPSULE THRE TIMES DAILY    ? Cholecalciferol (VITAMIN D3 PO) Take by mouth daily. TAKE ONE GEL PILL DAILY    ? Multiple Vitamin (MULTIVITAMIN PO) Take by mouth 2 (two) times daily. Take one in the A.M.,one in the P.M.    ? OVER THE COUNTER MEDICATION 3 (three) times daily. CRANBERRY WITH MANNOSE TAKE ONE CAPSULE DAILY    ? OVER THE COUNTER MEDICATION 3 (three) times daily. PROBOTIC TAKE ONE TABLET THREE TIMES A DAY    ? Pyridoxine HCl (VITAMIN B-6 PO) Take by mouth daily. TAKE ONE TABLET    ? triamterene-hydrochlorothiazide (DYAZIDE) 37.5-25 MG capsule Take 1 capsule by mouth daily.    ? ?Current Facility-Administered Medications  ?Medication Dose Route Frequency Provider Last Rate Last Admin  ? 0.9 %  sodium chloride infusion  500 mL Intravenous Once Tressia Danas, MD      ? 0.9 %  sodium chloride infusion  500 mL Intravenous Once Aziel Morgan, Willaim Rayas, MD      ? ? ?Allergies as of 09/13/2021  ? (No Known Allergies)  ? ? ?Family History  ?Problem Relation Age of Onset  ? Hypertension Mother   ? Hypertension Father   ? Hypertension Sister   ? Hypertension Brother   ? Colon cancer Neg Hx   ? Colon polyps Neg Hx   ? Esophageal cancer Neg Hx   ? Rectal cancer Neg Hx   ? Stomach cancer  Neg Hx   ? ? ?Social History  ? ?Socioeconomic History  ? Marital status: Married  ?  Spouse name: Not on file  ? Number of children: Not on file  ? Years of education: Not on file  ? Highest education level: Not on file  ?Occupational History  ? Not on file  ?Tobacco Use  ? Smoking status: Never  ?  Passive exposure: Never  ? Smokeless tobacco: Never  ?Vaping Use  ? Vaping Use: Never used  ?Substance and Sexual Activity  ? Alcohol use: No  ? Drug use: No  ? Sexual activity: Not on file  ?Other Topics Concern  ? Not on file  ?Social History Narrative  ? Not on file  ? ?Social Determinants of Health  ? ?Financial Resource Strain: Not on file  ?Food Insecurity: Not on file  ?Transportation Needs: Not on file  ?Physical Activity: Not on  file  ?Stress: Not on file  ?Social Connections: Not on file  ?Intimate Partner Violence: Not on file  ? ? ?Review of Systems: ?All other review of systems negative except as mentioned in the HPI. ? ?Physical Exam: ?Vital signs ?BP (!) 135/95   Pulse 69   Temp 97.7 ?F (36.5 ?C)   Ht 5\' 5"  (1.651 m)   Wt 190 lb (86.2 kg)   LMP 08/20/2021 (Approximate)   SpO2 100%   BMI 31.62 kg/m?  ? ?General:   Alert,  Well-developed, pleasant and cooperative in NAD ?Lungs:  Clear throughout to auscultation.   ?Heart:  Regular rate and rhythm ?Abdomen:  Soft, nontender and nondistended.   ?Neuro/Psych:  Alert and cooperative. Normal mood and affect. A and O x 3 ? ?10/20/2021, MD ?[facility] ? ? ?

## 2021-09-13 NOTE — Op Note (Signed)
[facility] ?Patient Name: Kaylee DaltonSharon Carillo ?Procedure Date: 09/13/2021 3:45 PM ?MRN: [id number] ?Endoscopist: Willaim RayasSteven P. Adela LankArmbruster , MD ?Age: 46 ?Referring MD:  ?Date of Birth: Jan 02, 1976 ?Gender: Female ?Account #: [id number] ?Procedure:                Colonoscopy ?Indications:              Screening for colorectal malignant neoplasm,  ?                          patient had colonoscopy attempt earlier this  ?                          morning - aborted due to poor prep. Patient drank  ?                          more prep today, cleared for procedure ?Medicines:                Monitored Anesthesia Care ?Procedure:                Pre-Anesthesia Assessment: ?                          - Prior to the procedure, a History and Physical  ?                          was performed, and patient medications and  ?                          allergies were reviewed. The patient's tolerance of  ?                          previous anesthesia was also reviewed. The risks  ?                          and benefits of the procedure and the sedation  ?                          options and risks were discussed with the patient.  ?                          All questions were answered, and informed consent  ?                          was obtained. Prior Anticoagulants: The patient has  ?                          taken no previous anticoagulant or antiplatelet  ?                          agents. ASA Grade Assessment: II - A patient with  ?                          mild systemic disease. After reviewing the risks  ?  and benefits, the patient was deemed in  ?                          satisfactory condition to undergo the procedure. ?                          After obtaining informed consent, the colonoscope  ?                          was passed under direct vision. Throughout the  ?                          procedure, the patient's blood pressure, pulse, and  ?                          oxygen saturations were  monitored continuously. The  ?                          Olympus [id number]-[id number] (#[id number]) Colonoscope was  ?                          introduced through the anus and advanced to the the  ?                          cecum, identified by appendiceal orifice and  ?                          ileocecal valve. The colonoscopy was performed  ?                          without difficulty. The patient tolerated the  ?                          procedure well. The quality of the bowel  ?                          preparation was good. The ileocecal valve,  ?                          appendiceal orifice, and rectum were photographed. ?Scope In: 3:47:13 PM ?Scope Out: 4:08:36 PM ?Scope Withdrawal Time: 0 hours 14 minutes 24 seconds  ?Total Procedure Duration: 0 hours 21 minutes 23 seconds  ?Findings:                 The perianal and digital rectal examinations were  ?                          normal. ?                          The colon was long and redundant. ?                          Internal hemorrhoids were found during  ?  retroflexion. The hemorrhoids were small. ?                          The exam was otherwise without abnormality. No  ?                          polyps ?Complications:            No immediate complications. Estimated blood loss:  ?                          None. ?Estimated Blood Loss:     Estimated blood loss: none. ?Impression:               - Long and redundant colon. ?                          - Internal hemorrhoids. ?                          - The examination was otherwise normal. ?                          - No polyps ?Recommendation:           - Patient has a contact number available for  ?                          emergencies. The signs and symptoms of potential  ?                          delayed complications were discussed with the  ?                          patient. Return to normal activities tomorrow.  ?                          Written discharge instructions were provided  to the  ?                          patient. ?                          - Resume previous diet. ?                          - Continue present medications. ?                          - Repeat colonoscopy in 10 years for screening  ?                          purposes. ?Willaim Rayas. Karilynn Carranza, MD ?09/13/2021 4:12:28 PM ?This report has been signed electronically. ?

## 2021-09-13 NOTE — Progress Notes (Signed)
? ?  Referring Provider: Pincus Sanes, MD ?Primary Care Physician:  Pincus Sanes, MD ? ?Indication for Procedure:  Colon cancer screening ? ? ?IMPRESSION:  ?Need for colon cancer screening ?Appropriate candidate for monitored anesthesia care ? ?PLAN: ?Colonoscopy in the [facility] today ? ? ?HPI: Kaylee May is a 46 y.o. female presents for screening colonoscopy. ? ?No prior colonoscopy or colon cancer screening. ? ?No baseline GI symptoms.  ? ?No known family history of colon cancer or polyps. No family history of uterine/endometrial cancer, pancreatic cancer or gastric/stomach cancer. ? ? ?Past Medical History:  ?Diagnosis Date  ? Hypertension   ? ? ?Past Surgical History:  ?Procedure Laterality Date  ? TUBAL LIGATION    ? ? ?Current Outpatient Medications  ?Medication Sig Dispense Refill  ? amLODipine (NORVASC) 10 MG tablet Take 10 mg by mouth daily.    ? BIOTIN PO Take by mouth daily. TAKE ONE CAPSULE THRE TIMES DAILY    ? Cholecalciferol (VITAMIN D3 PO) Take by mouth daily. TAKE ONE GEL PILL DAILY    ? Multiple Vitamin (MULTIVITAMIN PO) Take by mouth 2 (two) times daily. Take one in the A.M.,one in the P.M.    ? OVER THE COUNTER MEDICATION 3 (three) times daily. CRANBERRY WITH MANNOSE TAKE ONE CAPSULE DAILY    ? OVER THE COUNTER MEDICATION 3 (three) times daily. PROBOTIC TAKE ONE TABLET THREE TIMES A DAY    ? Pyridoxine HCl (VITAMIN B-6 PO) Take by mouth daily. TAKE ONE TABLET    ? triamterene-hydrochlorothiazide (DYAZIDE) 37.5-25 MG capsule Take 1 capsule by mouth daily.    ? ?Current Facility-Administered Medications  ?Medication Dose Route Frequency Provider Last Rate Last Admin  ? 0.9 %  sodium chloride infusion  500 mL Intravenous Once Tressia Danas, MD      ? ? ?Allergies as of 09/13/2021  ? (No Known Allergies)  ? ? ?Family History  ?Problem Relation Age of Onset  ? Hypertension Mother   ? Hypertension Father   ? Hypertension Sister   ? Hypertension Brother   ? Colon cancer Neg Hx   ? Colon polyps Neg  Hx   ? Esophageal cancer Neg Hx   ? Rectal cancer Neg Hx   ? Stomach cancer Neg Hx   ? ? ? ?Physical Exam: ?General:   Alert,  well-nourished, pleasant and cooperative in NAD ?Head:  Normocephalic and atraumatic. ?Eyes:  Sclera clear, no icterus.   Conjunctiva pink. ?Mouth:  No deformity or lesions.   ?Neck:  Supple; no masses or thyromegaly. ?Lungs:  Clear throughout to auscultation.   No wheezes. ?Heart:  Regular rate and rhythm; no murmurs. ?Abdomen:  Soft, non-tender, nondistended, normal bowel sounds, no rebound or guarding.  ?Msk:  Symmetrical. No boney deformities ?LAD: No inguinal or umbilical LAD ?Extremities:  No clubbing or edema. ?Neurologic:  Alert and  oriented x4;  grossly nonfocal ?Skin:  No obvious rash or bruise. ?Psych:  Alert and cooperative. Normal mood and affect. ? ? ? ? ?Studies/Results: ?No results found. ? ? ? ?Ophie Burrowes L. Orvan Falconer, MD, MPH ?09/13/2021, 9:02 AM ? ? ? ?  ?

## 2021-09-13 NOTE — Progress Notes (Signed)
Pt's states no medical or surgical changes since previsit or office visit. 

## 2021-09-13 NOTE — Progress Notes (Signed)
Report to PACU, RN, vss, BBS= Clear.  

## 2021-09-13 NOTE — Progress Notes (Signed)
Sedate, gd SR, tolerated procedure well, VSS, report to RN 

## 2021-09-13 NOTE — Progress Notes (Signed)
Patient was scheduled earlier today with Dr. Tarri Glenn, she was not cleaned out. Rescheduled for this afternoon with Dr. Havery Moros. She did Plenvu prep and says stool is now a clear yellow.  IV in R AC.  ?

## 2021-09-15 ENCOUNTER — Telehealth: Payer: Self-pay

## 2021-09-15 NOTE — Telephone Encounter (Signed)
?  Follow up Call- ? ? ?  09/13/2021  ?  2:20 PM 09/13/2021  ?  8:13 AM  ?Call back number  ?Post procedure Call Back phone  # [phone]  ?Permission to leave phone message Yes Yes  ?  ? ?Patient questions: ? ?Do you have a fever, pain , or abdominal swelling? No. ?Pain Score  0 * ? ?Have you tolerated food without any problems? Yes.   ? ?Have you been able to return to your normal activities? Yes.   ? ?Do you have any questions about your discharge instructions: ?Diet   No. ?Medications  No. ?Follow up visit  No. ? ?Do you have questions or concerns about your Care? No. ? ?Actions: ?* If pain score is 4 or above: ?No action needed, pain <4. ? ? ?

## 2021-11-15 ENCOUNTER — Other Ambulatory Visit: Payer: Self-pay | Admitting: Internal Medicine

## 2021-11-15 ENCOUNTER — Other Ambulatory Visit: Payer: Self-pay

## 2021-11-15 MED ORDER — AMLODIPINE BESYLATE 10 MG PO TABS
10.0000 mg | ORAL_TABLET | Freq: Every day | ORAL | 0 refills | Status: DC
  Filled 2021-11-15: qty 90, 90d supply, fill #0

## 2021-11-15 MED ORDER — TRIAMTERENE-HCTZ 37.5-25 MG PO CAPS
1.0000 | ORAL_CAPSULE | Freq: Every day | ORAL | 0 refills | Status: DC
Start: 2021-11-15 — End: 2021-12-02
  Filled 2021-11-15: qty 90, 90d supply, fill #0

## 2021-11-29 ENCOUNTER — Telehealth: Payer: Self-pay | Admitting: Internal Medicine

## 2021-11-29 NOTE — Telephone Encounter (Signed)
Caller & Relationship to patient:Self   Call back number:[phone]   Date of last office visit:03/05/21   Date of next office visit:03/07/22   Medication(s) to be refilled:amLODipine (NORVASC) 10 MG tablet   triamterene-hydrochlorothiazide (DYAZIDE) 37.5-25 MG capsule  Preferred Pharmacy:  [facility] [phone] - [place], [place] - [phone] AT [facility] [address] & [facility] Phone:  [phone]  Fax:  [phone]

## 2021-12-01 NOTE — Telephone Encounter (Signed)
PT calls today requesting status on these prescriptions!

## 2021-12-02 ENCOUNTER — Other Ambulatory Visit: Payer: Self-pay

## 2021-12-02 MED ORDER — AMLODIPINE BESYLATE 10 MG PO TABS: 10.0000 mg | ORAL_TABLET | Freq: Every day | ORAL | 0 refills | Status: DC

## 2021-12-02 MED ORDER — TRIAMTERENE-HCTZ 37.5-25 MG PO CAPS: 1.0000 | ORAL_CAPSULE | Freq: Every day | ORAL | 0 refills | Status: DC

## 2021-12-02 NOTE — Telephone Encounter (Signed)
Pt called in regarding these medications.   States she needs the medication for her BP. Please send them ASAP.

## 2021-12-02 NOTE — Telephone Encounter (Signed)
Called patient and informed patient that medication was sent to [facility] outpatient pharmacy on 11/15/2021. Patient states this was the wrong pharmacy. I resent it to the requested pharmacy

## 2021-12-02 NOTE — Addendum Note (Signed)
Addended by: Jerrell Belfast on: 12/02/2021 01:32 PM   Modules accepted: Orders

## 2022-02-25 ENCOUNTER — Other Ambulatory Visit: Payer: Self-pay | Admitting: Internal Medicine

## 2022-03-01 ENCOUNTER — Other Ambulatory Visit: Payer: Self-pay

## 2022-03-01 MED ORDER — AMLODIPINE BESYLATE 10 MG PO TABS: 10.0000 mg | ORAL_TABLET | Freq: Every day | ORAL | 0 refills | Status: DC

## 2022-03-01 MED ORDER — TRIAMTERENE-HCTZ 37.5-25 MG PO CAPS: 1.0000 | ORAL_CAPSULE | Freq: Every day | ORAL | 0 refills | Status: DC

## 2022-03-06 ENCOUNTER — Encounter: Payer: Self-pay | Admitting: Internal Medicine

## 2022-03-06 NOTE — Patient Instructions (Addendum)
Blood work was ordered.     Medications changes include :      Your prescription(s) have been sent to your pharmacy.    A referral was ordered for XX.     Someone from that office will call you to schedule an appointment.    Return in about 1 year (around 03/08/2023) for Physical Exam.   Health Maintenance, Female Adopting a healthy lifestyle and getting preventive care are important in promoting health and wellness. Ask your health care provider about: The right schedule for you to have regular tests and exams. Things you can do on your own to prevent diseases and keep yourself healthy. What should I know about diet, weight, and exercise? Eat a healthy diet  Eat a diet that includes plenty of vegetables, fruits, low-fat dairy products, and lean protein. Do not eat a lot of foods that are high in solid fats, added sugars, or sodium. Maintain a healthy weight Body mass index (BMI) is used to identify weight problems. It estimates body fat based on height and weight. Your health care provider can help determine your BMI and help you achieve or maintain a healthy weight. Get regular exercise Get regular exercise. This is one of the most important things you can do for your health. Most adults should: Exercise for at least 150 minutes each week. The exercise should increase your heart rate and make you sweat (moderate-intensity exercise). Do strengthening exercises at least twice a week. This is in addition to the moderate-intensity exercise. Spend less time sitting. Even light physical activity can be beneficial. Watch cholesterol and blood lipids Have your blood tested for lipids and cholesterol at 47 years of age, then have this test every 5 years. Have your cholesterol levels checked more often if: Your lipid or cholesterol levels are high. You are older than 47 years of age. You are at high risk for heart disease. What should I know about cancer screening? Depending on  your health history and family history, you may need to have cancer screening at various ages. This may include screening for: Breast cancer. Cervical cancer. Colorectal cancer. Skin cancer. Lung cancer. What should I know about heart disease, diabetes, and high blood pressure? Blood pressure and heart disease High blood pressure causes heart disease and increases the risk of stroke. This is more likely to develop in people who have high blood pressure readings or are overweight. Have your blood pressure checked: Every 3-5 years if you are 67-52 years of age. Every year if you are 76 years old or older. Diabetes Have regular diabetes screenings. This checks your fasting blood sugar level. Have the screening done: Once every three years after age 88 if you are at a normal weight and have a low risk for diabetes. More often and at a younger age if you are overweight or have a high risk for diabetes. What should I know about preventing infection? Hepatitis B If you have a higher risk for hepatitis B, you should be screened for this virus. Talk with your health care provider to find out if you are at risk for hepatitis B infection. Hepatitis C Testing is recommended for: Everyone born from [birth year] through 1965. Anyone with known risk factors for hepatitis C. Sexually transmitted infections (STIs) Get screened for STIs, including gonorrhea and chlamydia, if: You are sexually active and are younger than 47 years of age. You are older than 47 years of age and your health care provider tells you  that you are at risk for this type of infection. Your sexual activity has changed since you were last screened, and you are at increased risk for chlamydia or gonorrhea. Ask your health care provider if you are at risk. Ask your health care provider about whether you are at high risk for HIV. Your health care provider may recommend a prescription medicine to help prevent HIV infection. If you choose to take  medicine to prevent HIV, you should first get tested for HIV. You should then be tested every 3 months for as long as you are taking the medicine. Pregnancy If you are about to stop having your period (premenopausal) and you may become pregnant, seek counseling before you get pregnant. Take 400 to 800 micrograms (mcg) of folic acid every day if you become pregnant. Ask for birth control (contraception) if you want to prevent pregnancy. Osteoporosis and menopause Osteoporosis is a disease in which the bones lose minerals and strength with aging. This can result in bone fractures. If you are 3 years old or older, or if you are at risk for osteoporosis and fractures, ask your health care provider if you should: Be screened for bone loss. Take a calcium or vitamin D supplement to lower your risk of fractures. Be given hormone replacement therapy (HRT) to treat symptoms of menopause. Follow these instructions at home: Alcohol use Do not drink alcohol if: Your health care provider tells you not to drink. You are pregnant, may be pregnant, or are planning to become pregnant. If you drink alcohol: Limit how much you have to: 0-1 drink a day. Know how much alcohol is in your drink. In the [place] one drink equals one 12 oz bottle of beer (355 mL), one 5 oz glass of wine (148 mL), or one 1 oz glass of hard liquor (44 mL). Lifestyle Do not use any products that contain nicotine or tobacco. These products include cigarettes, chewing tobacco, and vaping devices, such as e-cigarettes. If you need help quitting, ask your health care provider. Do not use street drugs. Do not share needles. Ask your health care provider for help if you need support or information about quitting drugs. General instructions Schedule regular health, dental, and eye exams. Stay current with your vaccines. Tell your health care provider if: You often feel depressed. You have ever been abused or do not feel safe at  home. Summary Adopting a healthy lifestyle and getting preventive care are important in promoting health and wellness. Follow your health care provider's instructions about healthy diet, exercising, and getting tested or screened for diseases. Follow your health care provider's instructions on monitoring your cholesterol and blood pressure. This information is not intended to replace advice given to you by your health care provider. Make sure you discuss any questions you have with your health care provider. Document Revised: 10/19/2020 Document Reviewed: 10/19/2020 Elsevier Patient Education  [place].

## 2022-03-06 NOTE — Progress Notes (Signed)
Subjective:    Patient ID: Kaylee May, female    DOB: [birth year], 46 y.o.   MRN: [id number]      HPI Kaylee May is here for a Physical exam.      Medications and allergies reviewed with patient and updated if appropriate.  Current Outpatient Medications on File Prior to Visit  Medication Sig Dispense Refill  . amLODipine (NORVASC) 10 MG tablet Take 1 tablet (10 mg total) by mouth daily. 90 tablet 0  . BIOTIN PO Take by mouth daily. TAKE ONE CAPSULE THRE TIMES DAILY    . Cholecalciferol (VITAMIN D3 PO) Take by mouth daily. TAKE ONE GEL PILL DAILY    . Multiple Vitamin (MULTIVITAMIN PO) Take by mouth 2 (two) times daily. Take one in the A.M.,one in the P.M.    . OVER THE COUNTER MEDICATION 3 (three) times daily. CRANBERRY WITH MANNOSE TAKE ONE CAPSULE DAILY    . OVER THE COUNTER MEDICATION 3 (three) times daily. PROBOTIC TAKE ONE TABLET THREE TIMES A DAY    . Pyridoxine HCl (VITAMIN B-6 PO) Take by mouth daily. TAKE ONE TABLET     No current facility-administered medications on file prior to visit.    Review of Systems     Objective:  There were no vitals filed for this visit. There were no vitals filed for this visit. There is no height or weight on file to calculate BMI.  BP Readings from Last 3 Encounters:  03/11/22 136/88  09/13/21 114/79  09/13/21 125/87    Wt Readings from Last 3 Encounters:  03/11/22 183 lb (83 kg)  09/13/21 190 lb (86.2 kg)  09/13/21 190 lb (86.2 kg)       Physical Exam Constitutional: She appears well-developed and well-nourished. No distress.  HENT:  Head: Normocephalic and atraumatic.  Right Ear: External ear normal. Normal ear canal and TM Left Ear: External ear normal.  Normal ear canal and TM Mouth/Throat: Oropharynx is clear and moist.  Eyes: Conjunctivae normal.  Neck: Neck supple. No tracheal deviation present. No thyromegaly present.  No carotid bruit  Cardiovascular: Normal rate, regular rhythm and normal heart sounds.    No murmur heard.  No edema. Pulmonary/Chest: Effort normal and breath sounds normal. No respiratory distress. She has no wheezes. She has no rales.  Breast: deferred   Abdominal: Soft. She exhibits no distension. There is no tenderness.  Lymphadenopathy: She has no cervical adenopathy.  Skin: Skin is warm and dry. She is not diaphoretic.  Psychiatric: She has a normal mood and affect. Her behavior is normal.     Lab Results  Component Value Date   WBC 4.5 03/11/2022   HGB 13.8 03/11/2022   HCT 40.6 03/11/2022   PLT 371.0 03/11/2022   GLUCOSE 88 03/11/2022   CHOL 210 (H) 03/11/2022   TRIG 111.0 03/11/2022   HDL 49.60 03/11/2022   LDLCALC 138 (H) 03/11/2022   ALT 12 03/11/2022   AST 14 03/11/2022   NA 138 03/11/2022   K 3.3 (L) 03/11/2022   CL 98 03/11/2022   CREATININE 0.84 03/11/2022   BUN 13 03/11/2022   CO2 29 03/11/2022   TSH 0.41 03/11/2022         Assessment & Plan:   Physical exam: Screening blood work  ordered Exercise   Weight   Substance abuse  none   Reviewed recommended immunizations.   Health Maintenance  Topic Date Due  . MAMMOGRAM  Never done  . PAP SMEAR-Modifier  Never done  .  INFLUENZA VACCINE  09/11/2022 (Originally 01/11/2022)  . TETANUS/TDAP  06/04/2025  . COLONOSCOPY (Pts 45-46yrs Insurance coverage will need to be confirmed)  09/14/2031  . HPV VACCINES  Aged Out  . COVID-19 Vaccine  Discontinued  . Hepatitis C Screening  Discontinued  . HIV Screening  Discontinued          See Problem List for Assessment and Plan of chronic medical problems.     This encounter was created in error - please disregard.

## 2022-03-07 ENCOUNTER — Encounter: Payer: BC Managed Care – PPO | Admitting: Internal Medicine

## 2022-03-07 NOTE — Assessment & Plan Note (Signed)
Chronic BP well controlled Continue amlodipine 10 mg daily, dyazide 37.5-25 mg daily Cmp, cbc

## 2022-03-10 ENCOUNTER — Encounter: Payer: Self-pay | Admitting: Internal Medicine

## 2022-03-10 DIAGNOSIS — E785 Hyperlipidemia, unspecified: Secondary | ICD-10-CM | POA: Insufficient documentation

## 2022-03-10 DIAGNOSIS — E7800 Pure hypercholesterolemia, unspecified: Secondary | ICD-10-CM | POA: Insufficient documentation

## 2022-03-10 NOTE — Progress Notes (Signed)
Subjective:    Patient ID: Kaylee May, female    DOB: 02-21-1976, 46 y.o.   MRN: [id number]      HPI Kaylee May is here for a Physical exam.    Kaylee May has no concerns.     Medications and allergies reviewed with patient and updated if appropriate.  Current Outpatient Medications on File Prior to Visit  Medication Sig Dispense Refill   amLODipine (NORVASC) 10 MG tablet Take 1 tablet (10 mg total) by mouth daily. 90 tablet 0   BIOTIN PO Take by mouth daily. TAKE ONE CAPSULE THRE TIMES DAILY     Cholecalciferol (VITAMIN D3 PO) Take by mouth daily. TAKE ONE GEL PILL DAILY     Multiple Vitamin (MULTIVITAMIN PO) Take by mouth 2 (two) times daily. Take one in the A.M.,one in the P.M.     OVER THE COUNTER MEDICATION 3 (three) times daily. CRANBERRY WITH MANNOSE TAKE ONE CAPSULE DAILY     OVER THE COUNTER MEDICATION 3 (three) times daily. PROBOTIC TAKE ONE TABLET THREE TIMES A DAY     Pyridoxine HCl (VITAMIN B-6 PO) Take by mouth daily. TAKE ONE TABLET     triamterene-hydrochlorothiazide (DYAZIDE) 37.5-25 MG capsule Take 1 each (1 capsule total) by mouth daily. 90 capsule 0   No current facility-administered medications on file prior to visit.    Review of Systems  Constitutional:  Negative for fever.  Eyes:  Negative for visual disturbance.  Respiratory:  Negative for cough, shortness of breath and wheezing.   Cardiovascular:  Negative for chest pain, palpitations and leg swelling.  Gastrointestinal:  Negative for abdominal pain, blood in stool, constipation, diarrhea and nausea.       No gerd  Genitourinary:  Negative for dysuria.  Musculoskeletal:  Negative for arthralgias and back pain.  Skin:  Negative for rash.  Neurological:  Negative for light-headedness and headaches.  Psychiatric/Behavioral:  Negative for dysphoric mood. The patient is not nervous/anxious.        Objective:   Vitals:   03/11/22 0755  BP: 136/88  Pulse: 76  Temp: 98.2 F (36.8 C)  SpO2: 94%    Filed Weights   03/11/22 0755  Weight: 183 lb (83 kg)   Body mass index is 30.45 kg/m.  BP Readings from Last 3 Encounters:  03/11/22 136/88  09/13/21 114/79  09/13/21 125/87    Wt Readings from Last 3 Encounters:  03/11/22 183 lb (83 kg)  09/13/21 190 lb (86.2 kg)  09/13/21 190 lb (86.2 kg)       Physical Exam Constitutional: Kaylee May appears well-developed and well-nourished. No distress.  HENT:  Head: Normocephalic and atraumatic.  Right Ear: External ear normal. Normal ear canal and TM Left Ear: External ear normal.  Normal ear canal and TM Mouth/Throat: Oropharynx is clear and moist.  Eyes: Conjunctivae normal.  Neck: Neck supple. No tracheal deviation present. No thyromegaly present.  No carotid bruit  Cardiovascular: Normal rate, regular rhythm and normal heart sounds.   No murmur heard.  No edema. Pulmonary/Chest: Effort normal and breath sounds normal. No respiratory distress. Kaylee May has no wheezes. Kaylee May has no rales.  Breast: deferred   Abdominal: Soft. Kaylee May exhibits no distension. There is no tenderness.  Lymphadenopathy: Kaylee May has no cervical adenopathy.  Skin: Skin is warm and dry. Kaylee May is not diaphoretic.  Psychiatric: Kaylee May has a normal mood and affect. Kaylee May behavior is normal.     Lab Results  Component Value Date   WBC 6.3 03/05/2021  HGB 13.4 03/05/2021   HCT 40.2 03/05/2021   PLT 342.0 03/05/2021   GLUCOSE 83 03/05/2021   CHOL 208 (H) 03/05/2021   TRIG 105.0 03/05/2021   HDL 43.70 03/05/2021   LDLCALC 144 (H) 03/05/2021   ALT 13 03/05/2021   AST 17 03/05/2021   NA 138 03/05/2021   K 3.6 03/05/2021   CL 98 03/05/2021   CREATININE 0.78 03/05/2021   BUN 10 03/05/2021   CO2 31 03/05/2021   TSH 0.40 03/05/2021         Assessment & Plan:   Physical exam: Screening blood work  ordered Exercise  doing a lot of walking throughout day Weight  encouraged weight loss Substance abuse  none   Reviewed recommended immunizations.   Health  Maintenance  Topic Date Due   MAMMOGRAM  Never done   PAP SMEAR-Modifier  Never done   INFLUENZA VACCINE  09/11/2022 (Originally 01/11/2022)   TETANUS/TDAP  06/04/2025   COLONOSCOPY (Pts 45-26yrs Insurance coverage will need to be confirmed)  09/14/2031   HPV VACCINES  Aged Out   COVID-19 Vaccine  Discontinued   Hepatitis C Screening  Discontinued   HIV Screening  Discontinued          See Problem List for Assessment and Plan of chronic medical problems.

## 2022-03-10 NOTE — Patient Instructions (Addendum)
Blood work was ordered.     Medications changes include :   None   Your prescription(s) have been sent to your pharmacy.    A mammogram was ordered.   A referral was ordered for a Gynecologist - Dr Talbert Nan.     Someone from that office will call you to schedule an appointment.    Return in about 1 year (around 03/12/2023) for Physical Exam.   Health Maintenance, Female Adopting a healthy lifestyle and getting preventive care are important in promoting health and wellness. Ask your health care provider about: The right schedule for you to have regular tests and exams. Things you can do on your own to prevent diseases and keep yourself healthy. What should I know about diet, weight, and exercise? Eat a healthy diet  Eat a diet that includes plenty of vegetables, fruits, low-fat dairy products, and lean protein. Do not eat a lot of foods that are high in solid fats, added sugars, or sodium. Maintain a healthy weight Body mass index (BMI) is used to identify weight problems. It estimates body fat based on height and weight. Your health care provider can help determine your BMI and help you achieve or maintain a healthy weight. Get regular exercise Get regular exercise. This is one of the most important things you can do for your health. Most adults should: Exercise for at least 150 minutes each week. The exercise should increase your heart rate and make you sweat (moderate-intensity exercise). Do strengthening exercises at least twice a week. This is in addition to the moderate-intensity exercise. Spend less time sitting. Even light physical activity can be beneficial. Watch cholesterol and blood lipids Have your blood tested for lipids and cholesterol at 47 years of age, then have this test every 5 years. Have your cholesterol levels checked more often if: Your lipid or cholesterol levels are high. You are older than 47 years of age. You are at high risk for heart  disease. What should I know about cancer screening? Depending on your health history and family history, you may need to have cancer screening at various ages. This may include screening for: Breast cancer. Cervical cancer. Colorectal cancer. Skin cancer. Lung cancer. What should I know about heart disease, diabetes, and high blood pressure? Blood pressure and heart disease High blood pressure causes heart disease and increases the risk of stroke. This is more likely to develop in people who have high blood pressure readings or are overweight. Have your blood pressure checked: Every 3-5 years if you are 43-52 years of age. Every year if you are 55 years old or older. Diabetes Have regular diabetes screenings. This checks your fasting blood sugar level. Have the screening done: Once every three years after age 60 if you are at a normal weight and have a low risk for diabetes. More often and at a younger age if you are overweight or have a high risk for diabetes. What should I know about preventing infection? Hepatitis B If you have a higher risk for hepatitis B, you should be screened for this virus. Talk with your health care provider to find out if you are at risk for hepatitis B infection. Hepatitis C Testing is recommended for: Everyone born from [birth year] through 1965. Anyone with known risk factors for hepatitis C. Sexually transmitted infections (STIs) Get screened for STIs, including gonorrhea and chlamydia, if: You are sexually active and are younger than 47 years of age. You are older than 47  years of age and your health care provider tells you that you are at risk for this type of infection. Your sexual activity has changed since you were last screened, and you are at increased risk for chlamydia or gonorrhea. Ask your health care provider if you are at risk. Ask your health care provider about whether you are at high risk for HIV. Your health care provider may recommend a  prescription medicine to help prevent HIV infection. If you choose to take medicine to prevent HIV, you should first get tested for HIV. You should then be tested every 3 months for as long as you are taking the medicine. Pregnancy If you are about to stop having your period (premenopausal) and you may become pregnant, seek counseling before you get pregnant. Take 400 to 800 micrograms (mcg) of folic acid every day if you become pregnant. Ask for birth control (contraception) if you want to prevent pregnancy. Osteoporosis and menopause Osteoporosis is a disease in which the bones lose minerals and strength with aging. This can result in bone fractures. If you are 54 years old or older, or if you are at risk for osteoporosis and fractures, ask your health care provider if you should: Be screened for bone loss. Take a calcium or vitamin D supplement to lower your risk of fractures. Be given hormone replacement therapy (HRT) to treat symptoms of menopause. Follow these instructions at home: Alcohol use Do not drink alcohol if: Your health care provider tells you not to drink. You are pregnant, may be pregnant, or are planning to become pregnant. If you drink alcohol: Limit how much you have to: 0-1 drink a day. Know how much alcohol is in your drink. In the [place] one drink equals one 12 oz bottle of beer (355 mL), one 5 oz glass of wine (148 mL), or one 1 oz glass of hard liquor (44 mL). Lifestyle Do not use any products that contain nicotine or tobacco. These products include cigarettes, chewing tobacco, and vaping devices, such as e-cigarettes. If you need help quitting, ask your health care provider. Do not use street drugs. Do not share needles. Ask your health care provider for help if you need support or information about quitting drugs. General instructions Schedule regular health, dental, and eye exams. Stay current with your vaccines. Tell your health care provider if: You often  feel depressed. You have ever been abused or do not feel safe at home. Summary Adopting a healthy lifestyle and getting preventive care are important in promoting health and wellness. Follow your health care provider's instructions about healthy diet, exercising, and getting tested or screened for diseases. Follow your health care provider's instructions on monitoring your cholesterol and blood pressure. This information is not intended to replace advice given to you by your health care provider. Make sure you discuss any questions you have with your health care provider. Document Revised: 10/19/2020 Document Reviewed: 10/19/2020 Elsevier Patient Education  [place].

## 2022-03-11 ENCOUNTER — Ambulatory Visit (INDEPENDENT_AMBULATORY_CARE_PROVIDER_SITE_OTHER): Payer: BC Managed Care – PPO | Admitting: Internal Medicine

## 2022-03-11 ENCOUNTER — Encounter: Payer: Self-pay | Admitting: Internal Medicine

## 2022-03-11 VITALS — BP 136/88 | HR 76 | Temp 98.2°F | Ht 65.0 in | Wt 183.0 lb

## 2022-03-11 DIAGNOSIS — Z0000 Encounter for general adult medical examination without abnormal findings: Secondary | ICD-10-CM | POA: Diagnosis not present

## 2022-03-11 DIAGNOSIS — Z124 Encounter for screening for malignant neoplasm of cervix: Secondary | ICD-10-CM

## 2022-03-11 DIAGNOSIS — E7849 Other hyperlipidemia: Secondary | ICD-10-CM | POA: Diagnosis not present

## 2022-03-11 DIAGNOSIS — Z1231 Encounter for screening mammogram for malignant neoplasm of breast: Secondary | ICD-10-CM | POA: Diagnosis not present

## 2022-03-11 DIAGNOSIS — I10 Essential (primary) hypertension: Secondary | ICD-10-CM

## 2022-03-11 LAB — COMPREHENSIVE METABOLIC PANEL
ALT: 12 U/L (ref 0–35)
AST: 14 U/L (ref 0–37)
Albumin: 4.6 g/dL (ref 3.5–5.2)
Alkaline Phosphatase: 67 U/L (ref 39–117)
BUN: 13 mg/dL (ref 6–23)
CO2: 29 mEq/L (ref 19–32)
Calcium: 10 mg/dL (ref 8.4–10.5)
Chloride: 98 mEq/L (ref 96–112)
Creatinine, Ser: 0.84 mg/dL (ref 0.40–1.20)
GFR: 82.93 mL/min (ref >60.00)
Glucose, Bld: 88 mg/dL (ref 70–99)
Potassium: 3.3 mEq/L — ABNORMAL LOW (ref 3.5–5.1)
Sodium: 138 mEq/L (ref 135–145)
Total Bilirubin: 0.4 mg/dL (ref 0.2–1.2)
Total Protein: 8 g/dL (ref 6.0–8.3)

## 2022-03-11 LAB — CBC WITH DIFFERENTIAL/PLATELET
Basophils Absolute: 0 10*3/uL (ref 0.0–0.1)
Basophils Relative: 0.8 % (ref 0.0–3.0)
Eosinophils Absolute: 0.1 10*3/uL (ref 0.0–0.7)
Eosinophils Relative: 2.7 % (ref 0.0–5.0)
HCT: 40.6 % (ref 36.0–46.0)
Hemoglobin: 13.8 g/dL (ref 12.0–15.0)
Lymphocytes Relative: 19.9 % (ref 12.0–46.0)
Lymphs Abs: 0.9 10*3/uL (ref 0.7–4.0)
MCHC: 33.9 g/dL (ref 30.0–36.0)
MCV: 94.3 fl (ref 78.0–100.0)
Monocytes Absolute: 0.4 10*3/uL (ref 0.1–1.0)
Monocytes Relative: 8.8 % (ref 3.0–12.0)
Neutro Abs: 3.1 10*3/uL (ref 1.4–7.7)
Neutrophils Relative %: 67.8 % (ref 43.0–77.0)
Platelets: 371 10*3/uL (ref 150.0–400.0)
RBC: 4.31 Mil/uL (ref 3.87–5.11)
RDW: 14.5 % (ref 11.5–15.5)
WBC: 4.5 10*3/uL (ref 4.0–10.5)

## 2022-03-11 LAB — LIPID PANEL
Cholesterol: 210 mg/dL — ABNORMAL HIGH (ref 0–200)
HDL: 49.6 mg/dL (ref >39.00)
LDL Cholesterol: 138 mg/dL — ABNORMAL HIGH (ref 0–99)
NonHDL: 160.22
Total CHOL/HDL Ratio: 4
Triglycerides: 111 mg/dL (ref 0.0–149.0)
VLDL: 22.2 mg/dL (ref 0.0–40.0)

## 2022-03-11 LAB — TSH: TSH: 0.41 u[IU]/mL (ref 0.35–5.50)

## 2022-03-11 MED ORDER — TRIAMTERENE-HCTZ 37.5-25 MG PO CAPS: 1.0000 | ORAL_CAPSULE | Freq: Every day | ORAL | 3 refills | Status: DC

## 2022-03-11 NOTE — Assessment & Plan Note (Signed)
Chronic °Regular exercise and healthy diet encouraged °Check lipid panel, TSH °Continue lifestyle control °

## 2022-03-11 NOTE — Assessment & Plan Note (Signed)
Chronic Blood pressure well controlled CMP, CBC Continue triamterene-hydrochlorothiazide 37.5-25 mg daily

## 2022-03-12 MED ORDER — POTASSIUM CHLORIDE CRYS ER 10 MEQ PO TBCR: 20.0000 meq | EXTENDED_RELEASE_TABLET | Freq: Every day | ORAL | 3 refills | Status: DC

## 2022-03-12 NOTE — Addendum Note (Signed)
Addended by: Binnie Rail on: 03/12/2022 08:14 AM   Modules accepted: Orders

## 2022-05-11 ENCOUNTER — Encounter: Payer: Self-pay | Admitting: Obstetrics and Gynecology

## 2022-05-11 ENCOUNTER — Other Ambulatory Visit (HOSPITAL_COMMUNITY)
Admission: RE | Admit: 2022-05-11 | Discharge: 2022-05-11 | Disposition: A | Discharge status: Home / Self Care | Payer: BC Managed Care – PPO | Source: Ambulatory Visit | Attending: Obstetrics and Gynecology | Admitting: Obstetrics and Gynecology

## 2022-05-11 ENCOUNTER — Ambulatory Visit (INDEPENDENT_AMBULATORY_CARE_PROVIDER_SITE_OTHER): Payer: BC Managed Care – PPO | Admitting: Obstetrics and Gynecology

## 2022-05-11 VITALS — BP 134/80 | HR 78 | Ht 63.0 in | Wt 186.0 lb

## 2022-05-11 DIAGNOSIS — Z124 Encounter for screening for malignant neoplasm of cervix: Secondary | ICD-10-CM

## 2022-05-11 DIAGNOSIS — Z01419 Encounter for gynecological examination (general) (routine) without abnormal findings: Secondary | ICD-10-CM | POA: Diagnosis not present

## 2022-05-11 NOTE — Patient Instructions (Signed)

## 2022-05-11 NOTE — Progress Notes (Signed)
46 y.o. G4P4. Married Black or [place] American Not Hispanic or Latino female here for annual exam.  Sexually active, no pain.  Period Cycle (Days): 28 Period Duration (Days): 5 Period Pattern: Regular Menstrual Flow: Moderate Menstrual Control: Maxi pad Dysmenorrhea: (!) Mild Dysmenorrhea Symptoms: Cramping  Patient's last menstrual period was 05/03/2022.          Sexually active: Yes.    The current method of family planning is tubal ligation  Exercising: Yes.     Walking sat work Smoker:  no  Health Maintenance: Pap:  2015 per pt History of abnormal Pap:  no MMG:  2016 or 2017 per pt. Scheduled 12/23 BMD:  n/a Colonoscopy: 09/13/21, f/u 10 years. TDaP:  06/05/15 Gardasil: n/a   reports that she has never smoked. She has never been exposed to tobacco smoke. She has never used smokeless tobacco. She reports that she does not drink alcohol and does not use drugs. 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 Husband is a Emergency planning/management officer. She has 4 adult children. No grandchildren. Oldest son is engaged. Everyone is local.   Past Medical History:  Diagnosis Date   Hypertension     Past Surgical History:  Procedure Laterality Date   TUBAL LIGATION      Current Outpatient Medications  Medication Sig Dispense Refill   amLODipine (NORVASC) 10 MG tablet Take 1 tablet (10 mg total) by mouth daily. 90 tablet 0   BIOTIN PO Take by mouth daily. TAKE ONE CAPSULE THRE TIMES DAILY     Cholecalciferol (VITAMIN D3 PO) Take by mouth daily. TAKE ONE GEL PILL DAILY     Multiple Vitamin (MULTIVITAMIN PO) Take by mouth 2 (two) times daily. Take one in the A.M.,one in the P.M.     OVER THE COUNTER MEDICATION 3 (three) times daily. CRANBERRY WITH MANNOSE TAKE ONE CAPSULE DAILY     OVER THE COUNTER MEDICATION 3 (three) times daily. PROBOTIC TAKE ONE TABLET THREE TIMES A DAY     potassium chloride (KLOR-CON M) 10 MEQ tablet Take 2 tablets (20 mEq total) by mouth daily. 180 tablet 3   Pyridoxine HCl  (VITAMIN B-6 PO) Take by mouth daily. TAKE ONE TABLET     triamterene-hydrochlorothiazide (DYAZIDE) 37.5-25 MG capsule Take 1 each (1 capsule total) by mouth daily. 90 capsule 3   No current facility-administered medications for this visit.    Family History  Problem Relation Age of Onset   Hypertension Mother    Hypertension Father    Hypertension Sister    Hypertension Brother    Colon cancer Neg Hx    Colon polyps Neg Hx    Esophageal cancer Neg Hx    Rectal cancer Neg Hx    Stomach cancer Neg Hx     Review of Systems  All other systems reviewed and are negative.   Exam:   BP 134/80 (BP Location: Right Arm, Patient Position: Sitting, Cuff Size: Large)   Pulse 78   Ht 5\' 3"  (1.6 m)   Wt 186 lb (84.4 kg)   LMP 05/03/2022   BMI 32.95 kg/m   Weight change: @WEIGHTCHANGE @ Height:   Height: 5\' 3"  (160 cm)  Ht Readings from Last 3 Encounters:  05/11/22 5\' 3"  (1.6 m)  03/11/22 5\' 5"  (1.651 m)  09/13/21 5\' 5"  (1.651 m)    General appearance: alert, cooperative and appears stated age Head: Normocephalic, without obvious abnormality, atraumatic Neck: no adenopathy, supple, symmetrical, trachea midline and thyroid normal to inspection  and palpation Lungs: clear to auscultation bilaterally Cardiovascular: regular rate and rhythm Breasts: normal appearance, no masses or tenderness Abdomen: soft, non-tender; non distended,  no masses,  no organomegaly Extremities: extremities normal, atraumatic, no cyanosis or edema Skin: Skin color, texture, turgor normal. No rashes or lesions Lymph nodes: Cervical, supraclavicular, and axillary nodes normal. No abnormal inguinal nodes palpated Neurologic: Grossly normal   Pelvic: External genitalia:  no lesions              Urethra:  normal appearing urethra with no masses, tenderness or lesions              Bartholins and Skenes: normal                 Vagina: normal appearing vagina with normal color and discharge, no lesions               Cervix: no lesions               Bimanual Exam:  Uterus:  normal size, contour, position, consistency, mobility, non-tender and anteverted              Adnexa: no mass, fullness, tenderness               Rectovaginal: Confirms               Anus:  normal sphincter tone, no lesions  Kimalexis, RMA chaperoned for the exam.  1. Well woman exam Discussed breast self exam Discussed calcium and vit D intake Mammogram scheduled Labs with primary Colonoscopy is UTD  2. Screening for cervical cancer - Cytology - PAP

## 2022-05-16 LAB — CYTOLOGY - PAP
Comment: NEGATIVE
Diagnosis: NEGATIVE
High risk HPV: NEGATIVE

## 2022-05-30 ENCOUNTER — Other Ambulatory Visit: Payer: Self-pay

## 2022-05-30 MED ORDER — AMLODIPINE BESYLATE 10 MG PO TABS: 10.0000 mg | ORAL_TABLET | Freq: Every day | ORAL | 3 refills | Status: DC

## 2022-06-10 ENCOUNTER — Ambulatory Visit
Admission: RE | Admit: 2022-06-10 | Discharge: 2022-06-10 | Disposition: A | Discharge status: Home / Self Care | Payer: BC Managed Care – PPO | Source: Ambulatory Visit | Attending: Internal Medicine | Admitting: Internal Medicine

## 2022-06-10 DIAGNOSIS — Z1231 Encounter for screening mammogram for malignant neoplasm of breast: Secondary | ICD-10-CM

## 2022-06-15 ENCOUNTER — Telehealth: Payer: Self-pay | Admitting: Internal Medicine

## 2022-06-15 ENCOUNTER — Other Ambulatory Visit: Payer: Self-pay | Admitting: Internal Medicine

## 2022-06-15 DIAGNOSIS — N6310 Unspecified lump in the right breast, unspecified quadrant: Secondary | ICD-10-CM

## 2022-06-15 NOTE — Telephone Encounter (Signed)
Patient called concerning her mammogram done on 06/10/2022.  She would like someone to call her concerning the results.  It is showing up in My Chart  Phone:  [phone]

## 2022-06-15 NOTE — Telephone Encounter (Signed)
Spoke with patient today and info given. 

## 2022-06-15 NOTE — Telephone Encounter (Signed)
Obviously she needs to go for further imaging of her right breast.  Sometimes the initial mammogram is not able to see specific area well-the breast tissue could be folded upon itself or if there are dense breasts that makes it difficult to see.  The diagnostic mammogram will do some additional imaging and do an ultrasound if needed.  It is common for women to get recalled for additional imaging.  The breast center should have already called her and schedule an appointment for a diagnostic mammogram.  They will do the mammogram first and then do the ultrasound if it is needed to evaluate further.  The radiologist will then talk to her afterwards and let her know the results and answer any questions she has.

## 2022-06-23 ENCOUNTER — Other Ambulatory Visit: Payer: Self-pay | Admitting: Internal Medicine

## 2022-06-23 ENCOUNTER — Ambulatory Visit
Admission: RE | Admit: 2022-06-23 | Discharge: 2022-06-23 | Disposition: A | Discharge status: Home / Self Care | Payer: BC Managed Care – PPO | Source: Ambulatory Visit | Attending: Internal Medicine | Admitting: Internal Medicine

## 2022-06-23 DIAGNOSIS — N6310 Unspecified lump in the right breast, unspecified quadrant: Secondary | ICD-10-CM

## 2022-06-24 ENCOUNTER — Telehealth: Payer: Self-pay

## 2022-06-24 NOTE — Telephone Encounter (Signed)
Of course I would like to tell her there is nothing to worry about, but cancer is a possibility.  It is hard to know without doing the biopsy and there are plenty of people to have biopsies and it is not cancer.

## 2022-06-24 NOTE — Telephone Encounter (Signed)
Spoke with patient today.

## 2022-06-28 ENCOUNTER — Ambulatory Visit
Admission: RE | Admit: 2022-06-28 | Discharge: 2022-06-28 | Disposition: A | Discharge status: Home / Self Care | Payer: BC Managed Care – PPO | Source: Ambulatory Visit | Attending: Internal Medicine | Admitting: Internal Medicine

## 2022-06-28 DIAGNOSIS — N6310 Unspecified lump in the right breast, unspecified quadrant: Secondary | ICD-10-CM

## 2022-06-28 HISTORY — PX: BREAST BIOPSY: SHX20

## 2022-10-28 ENCOUNTER — Other Ambulatory Visit: Payer: Self-pay

## 2022-10-28 DIAGNOSIS — I10 Essential (primary) hypertension: Secondary | ICD-10-CM

## 2022-10-28 MED ORDER — TRIAMTERENE-HCTZ 37.5-25 MG PO CAPS: 1.0000 | ORAL_CAPSULE | Freq: Every day | ORAL | 3 refills | Status: DC

## 2022-11-24 ENCOUNTER — Ambulatory Visit: Payer: BC Managed Care – PPO | Admitting: Obstetrics and Gynecology

## 2022-11-24 ENCOUNTER — Encounter: Payer: Self-pay | Admitting: Obstetrics and Gynecology

## 2022-11-24 VITALS — BP 110/84 | Temp 98.8°F | Wt 182.0 lb

## 2022-11-24 DIAGNOSIS — R102 Pelvic and perineal pain: Secondary | ICD-10-CM | POA: Diagnosis not present

## 2022-11-24 DIAGNOSIS — R3915 Urgency of urination: Secondary | ICD-10-CM

## 2022-11-24 LAB — URINALYSIS, COMPLETE
Bilirubin Urine: NEGATIVE
Glucose, UA: NEGATIVE
Hyaline Cast: NONE SEEN /LPF
Ketones, ur: NEGATIVE
Nitrite: NEGATIVE
Protein, ur: NEGATIVE
Specific Gravity, Urine: 1.015 (ref 1.001–1.035)
pH: 7.5 (ref 5.0–8.0)

## 2022-11-24 LAB — PREGNANCY, URINE: Preg Test, Ur: NEGATIVE

## 2022-11-24 MED ORDER — PHENAZOPYRIDINE HCL 200 MG PO TABS: 200.0000 mg | ORAL_TABLET | Freq: Three times a day (TID) | ORAL | 0 refills | Status: DC

## 2022-11-24 NOTE — Progress Notes (Signed)
GYNECOLOGY  VISIT   HPI: 47 y.o.   Married Black or [place] American Not Hispanic or Latino  female   [phone] with No LMP recorded.   here for she is having urgency of urination and frequency of urination. Symptoms started ~5 days ago just when her cycle was stopping. She is voiding normal amounts, but having urgency to void, no dysuria. She also had some intermittent throbbing pelvic pain over the last 5 days, can keep her from sleeping.  No fever, no flank pain.   GYNECOLOGIC HISTORY: No LMP recorded. Contraception: tubal ligation   Menopausal hormone therapy: none         OB History     Gravida  4   Para      Term      Preterm      AB  0   Living  4      SAB  0   IAB      Ectopic  0   Multiple      Live Births                 Patient Active Problem List   Diagnosis Date Noted   Hyperlipidemia 03/10/2022   Carpal tunnel syndrome, bilateral 09/21/2015   Essential hypertension 06/05/2015    Past Medical History:  Diagnosis Date   Hypertension     Past Surgical History:  Procedure Laterality Date   BREAST BIOPSY Right 06/28/2022   BREAST BIOPSY Right 06/28/2022   [place] RT BREAST BX W LOC DEV 1ST LESION IMG BX SPEC US GUIDE 06/28/2022 [facility]   TUBAL LIGATION      Current Outpatient Medications  Medication Sig Dispense Refill   amLODipine (NORVASC) 10 MG tablet Take 1 tablet (10 mg total) by mouth daily. 90 tablet 3   BIOTIN PO Take by mouth daily. TAKE ONE CAPSULE THRE TIMES DAILY     Cholecalciferol (VITAMIN D3 PO) Take by mouth daily. TAKE ONE GEL PILL DAILY     Multiple Vitamin (MULTIVITAMIN PO) Take by mouth 2 (two) times daily. Take one in the A.M.,one in the P.M.     OVER THE COUNTER MEDICATION 3 (three) times daily. CRANBERRY WITH MANNOSE TAKE ONE CAPSULE DAILY     OVER THE COUNTER MEDICATION 3 (three) times daily. PROBOTIC TAKE ONE TABLET THREE TIMES A DAY     potassium chloride (KLOR-CON M) 10 MEQ tablet Take 2 tablets (20 mEq  total) by mouth daily. 180 tablet 3   Pyridoxine HCl (VITAMIN B-6 PO) Take by mouth daily. TAKE ONE TABLET     triamterene-hydrochlorothiazide (DYAZIDE) 37.5-25 MG capsule Take 1 each (1 capsule total) by mouth daily. 90 capsule 3   No current facility-administered medications for this visit.     ALLERGIES: Patient has no known allergies.  Family History  Problem Relation Age of Onset   Hypertension Mother    Hypertension Father    Hypertension Sister    Hypertension Brother    Colon cancer Neg Hx    Colon polyps Neg Hx    Esophageal cancer Neg Hx    Rectal cancer Neg Hx    Stomach cancer Neg Hx     Social History   Socioeconomic History   Marital status: Married    Spouse name: Not on file   Number of children: Not on file   Years of education: Not on file   Highest education level: Not on file  Occupational History   Not on file  Tobacco Use   Smoking status: Never    Passive exposure: Never   Smokeless tobacco: Never  Vaping Use   Vaping Use: Never used  Substance and Sexual Activity   Alcohol use: No   Drug use: No   Sexual activity: Yes    Birth control/protection: None  Other Topics Concern   Not on file  Social History Narrative   Not on file   Social Determinants of Health   Financial Resource Strain: Not on file  Food Insecurity: Not on file  Transportation Needs: Not on file  Physical Activity: Not on file  Stress: Not on file  Social Connections: Not on file  Intimate Partner Violence: Not on file    Review of Systems  Genitourinary:  Positive for frequency and urgency.    PHYSICAL EXAMINATION:    There were no vitals taken for this visit.    General appearance: alert, cooperative and appears stated age CVA: not tender Abdomen: soft, non-tender; non distended, no masses,  no organomegaly  Pelvic: External genitalia:  no lesions              Urethra:  normal appearing urethra with no masses, tenderness or lesions               Bartholins and Skenes: normal                 Vagina: normal appearing vagina with normal color and discharge, no lesions. She does have brown d/c in her vagina.              Cervix: no cervical motion tenderness and no lesions              Bimanual Exam:  Uterus:  normal size, contour, position, consistency, mobility, non-tender and anteverted              Adnexa: no mass, fullness, tenderness              Rectovaginal: Yes.  .  Confirms.              Anus:  normal sphincter tone, no lesions  Pelvic floor: not tender  Chaperone was present for exam.  1. Urgency of urination Urine dip only with 0-3 WBC/HPF, 3-10 RBC (but spotting) and 6-10 squams. Doesn't look c/w UTI and symptoms aren't classic. Voiding normal amounts, no dysuria.  - Urinalysis, Complete - Urine Culture - phenazopyridine (PYRIDIUM) 200 MG tablet; Take 1 tablet (200 mg total) by mouth 3 (three) times daily with meals.  Dispense: 6 tablet; Refill: 0 -If her symptoms resolve with the pyridium she will let me know and I will call in antibiotics (prior to the weekend), otherwise will wait for the culture  2. Pelvic pain Normal exam, etiology not clear. - Pregnancy, urine (h/o tubal ligation) - US PELVIS TRANSVAGINAL NON-OB (TV ONLY); Future

## 2022-11-25 LAB — URINE CULTURE
MICRO NUMBER:: 15079011
Result:: NO GROWTH
SPECIMEN QUALITY:: ADEQUATE

## 2022-11-28 ENCOUNTER — Telehealth: Payer: Self-pay

## 2022-11-28 NOTE — Telephone Encounter (Signed)
-----   Message from Jerilynn Mages sent at 11/28/2022  9:51 AM EDT ----- Regarding: [place] Per patient she states RX she was given solved the problem so she does not feel the [place] is needed. Patient wants Dr Oscar La to confirm if [place] really has to be done since she no longer has the problem.

## 2022-11-28 NOTE — Telephone Encounter (Signed)
Pt seen on 11/24/22 for urinary urgency and pelvic pain.   Was prescribed pyridium and ucx returned negative. Please advise.

## 2022-11-28 NOTE — Telephone Encounter (Signed)
If her pain has resolved, you can cancel the u/s. Thanks!

## 2022-11-29 NOTE — Telephone Encounter (Signed)
Pt notified and voiced understanding. Will notify the appt desk and close encounter.

## 2023-01-02 ENCOUNTER — Other Ambulatory Visit: Payer: Self-pay

## 2023-01-02 MED ORDER — POTASSIUM CHLORIDE CRYS ER 10 MEQ PO TBCR: 20.0000 meq | EXTENDED_RELEASE_TABLET | Freq: Every day | ORAL | 3 refills | Status: DC

## 2023-01-12 ENCOUNTER — Other Ambulatory Visit: Payer: BC Managed Care – PPO

## 2023-01-12 ENCOUNTER — Other Ambulatory Visit: Payer: BC Managed Care – PPO | Admitting: Radiology

## 2023-03-13 ENCOUNTER — Encounter: Payer: Self-pay | Admitting: Internal Medicine

## 2023-03-13 NOTE — Patient Instructions (Addendum)
Blood work was ordered.   The lab is on the first floor.    Medications changes include :       A referral was ordered and someone will call you to schedule an appointment.     Return in about 6 months (around 09/12/2023) for follow up.    Health Maintenance, Female Adopting a healthy lifestyle and getting preventive care are important in promoting health and wellness. Ask your health care provider about: The right schedule for you to have regular tests and exams. Things you can do on your own to prevent diseases and keep yourself healthy. What should I know about diet, weight, and exercise? Eat a healthy diet  Eat a diet that includes plenty of vegetables, fruits, low-fat dairy products, and lean protein. Do not eat a lot of foods that are high in solid fats, added sugars, or sodium. Maintain a healthy weight Body mass index (BMI) is used to identify weight problems. It estimates body fat based on height and weight. Your health care provider can help determine your BMI and help you achieve or maintain a healthy weight. Get regular exercise Get regular exercise. This is one of the most important things you can do for your health. Most adults should: Exercise for at least 150 minutes each week. The exercise should increase your heart rate and make you sweat (moderate-intensity exercise). Do strengthening exercises at least twice a week. This is in addition to the moderate-intensity exercise. Spend less time sitting. Even light physical activity can be beneficial. Watch cholesterol and blood lipids Have your blood tested for lipids and cholesterol at 48 years of age, then have this test every 5 years. Have your cholesterol levels checked more often if: Your lipid or cholesterol levels are high. You are older than 48 years of age. You are at high risk for heart disease. What should I know about cancer screening? Depending on your health history and family history, you may  need to have cancer screening at various ages. This may include screening for: Breast cancer. Cervical cancer. Colorectal cancer. Skin cancer. Lung cancer. What should I know about heart disease, diabetes, and high blood pressure? Blood pressure and heart disease High blood pressure causes heart disease and increases the risk of stroke. This is more likely to develop in people who have high blood pressure readings or are overweight. Have your blood pressure checked: Every 3-5 years if you are 98-25 years of age. Every year if you are 54 years old or older. Diabetes Have regular diabetes screenings. This checks your fasting blood sugar level. Have the screening done: Once every three years after age 67 if you are at a normal weight and have a low risk for diabetes. More often and at a younger age if you are overweight or have a high risk for diabetes. What should I know about preventing infection? Hepatitis B If you have a higher risk for hepatitis B, you should be screened for this virus. Talk with your health care provider to find out if you are at risk for hepatitis B infection. Hepatitis C Testing is recommended for: Everyone born from [birth year] through 1965. Anyone with known risk factors for hepatitis C. Sexually transmitted infections (STIs) Get screened for STIs, including gonorrhea and chlamydia, if: You are sexually active and are younger than 48 years of age. You are older than 48 years of age and your health care provider tells you that you are at risk for this  type of infection. Your sexual activity has changed since you were last screened, and you are at increased risk for chlamydia or gonorrhea. Ask your health care provider if you are at risk. Ask your health care provider about whether you are at high risk for HIV. Your health care provider may recommend a prescription medicine to help prevent HIV infection. If you choose to take medicine to prevent HIV, you should first get  tested for HIV. You should then be tested every 3 months for as long as you are taking the medicine. Pregnancy If you are about to stop having your period (premenopausal) and you may become pregnant, seek counseling before you get pregnant. Take 400 to 800 micrograms (mcg) of folic acid every day if you become pregnant. Ask for birth control (contraception) if you want to prevent pregnancy. Osteoporosis and menopause Osteoporosis is a disease in which the bones lose minerals and strength with aging. This can result in bone fractures. If you are 61 years old or older, or if you are at risk for osteoporosis and fractures, ask your health care provider if you should: Be screened for bone loss. Take a calcium or vitamin D supplement to lower your risk of fractures. Be given hormone replacement therapy (HRT) to treat symptoms of menopause. Follow these instructions at home: Alcohol use Do not drink alcohol if: Your health care provider tells you not to drink. You are pregnant, may be pregnant, or are planning to become pregnant. If you drink alcohol: Limit how much you have to: 0-1 drink a day. Know how much alcohol is in your drink. In the [place] one drink equals one 12 oz bottle of beer (355 mL), one 5 oz glass of wine (148 mL), or one 1 oz glass of hard liquor (44 mL). Lifestyle Do not use any products that contain nicotine or tobacco. These products include cigarettes, chewing tobacco, and vaping devices, such as e-cigarettes. If you need help quitting, ask your health care provider. Do not use street drugs. Do not share needles. Ask your health care provider for help if you need support or information about quitting drugs. General instructions Schedule regular health, dental, and eye exams. Stay current with your vaccines. Tell your health care provider if: You often feel depressed. You have ever been abused or do not feel safe at home. Summary Adopting a healthy lifestyle and getting  preventive care are important in promoting health and wellness. Follow your health care provider's instructions about healthy diet, exercising, and getting tested or screened for diseases. Follow your health care provider's instructions on monitoring your cholesterol and blood pressure. This information is not intended to replace advice given to you by your health care provider. Make sure you discuss any questions you have with your health care provider. Document Revised: 10/19/2020 Document Reviewed: 10/19/2020 Elsevier Patient Education  2024 [employer].

## 2023-03-13 NOTE — Progress Notes (Unsigned)
Subjective:    Patient ID: Kaylee May, female    DOB: [birth year], 47 y.o.   MRN: [id number]      HPI Clarece is here for a Physical exam and her chronic medical problems.   Doing good - no concerns.    Medications and allergies reviewed with patient and updated if appropriate.  Current Outpatient Medications on File Prior to Visit  Medication Sig Dispense Refill   amLODipine (NORVASC) 10 MG tablet Take 1 tablet (10 mg total) by mouth daily. 90 tablet 3   BIOTIN PO Take by mouth daily. TAKE ONE CAPSULE THRE TIMES DAILY     Cholecalciferol (VITAMIN D3 PO) Take by mouth daily. TAKE ONE GEL PILL DAILY     Multiple Vitamin (MULTIVITAMIN PO) Take by mouth 2 (two) times daily. Take one in the A.M.,one in the P.M.     OVER THE COUNTER MEDICATION 3 (three) times daily. CRANBERRY WITH MANNOSE TAKE ONE CAPSULE DAILY     OVER THE COUNTER MEDICATION 3 (three) times daily. PROBOTIC TAKE ONE TABLET THREE TIMES A DAY     potassium chloride (KLOR-CON M) 10 MEQ tablet Take 2 tablets (20 mEq total) by mouth daily. 180 tablet 3   Pyridoxine HCl (VITAMIN B-6 PO) Take by mouth daily. TAKE ONE TABLET     triamterene-hydrochlorothiazide (DYAZIDE) 37.5-25 MG capsule Take 1 each (1 capsule total) by mouth daily. 90 capsule 3   No current facility-administered medications on file prior to visit.    Review of Systems  Constitutional:  Negative for fever.  Eyes:  Negative for visual disturbance.  Respiratory:  Negative for cough, shortness of breath and wheezing.   Cardiovascular:  Negative for chest pain, palpitations and leg swelling.  Gastrointestinal:  Negative for abdominal pain, blood in stool, constipation and diarrhea.       No gerd  Genitourinary:  Negative for dysuria.  Musculoskeletal:  Positive for back pain (occ - when doing some activities, house work). Negative for arthralgias.  Skin:  Negative for rash.  Neurological:  Negative for light-headedness and headaches.   Psychiatric/Behavioral:  Negative for dysphoric mood. The patient is not nervous/anxious.        Objective:   Vitals:   03/14/23 0752  BP: 128/74  Pulse: 60  Temp: 98 F (36.7 C)  SpO2: 95%   Filed Weights   03/14/23 0752  Weight: 187 lb (84.8 kg)   Body mass index is 33.13 kg/m.  BP Readings from Last 3 Encounters:  03/14/23 128/74  11/24/22 110/84  05/11/22 134/80    Wt Readings from Last 3 Encounters:  03/14/23 187 lb (84.8 kg)  11/24/22 182 lb (82.6 kg)  05/11/22 186 lb (84.4 kg)       Physical Exam Constitutional: She appears well-developed and well-nourished. No distress.  HENT:  Head: Normocephalic and atraumatic.  Right Ear: External ear normal. Normal ear canal and TM Left Ear: External ear normal.  Normal ear canal and TM Mouth/Throat: Oropharynx is clear and moist.  Eyes: Conjunctivae normal.  Neck: Neck supple. No tracheal deviation present. No thyromegaly present.  No carotid bruit  Cardiovascular: Normal rate, regular rhythm and normal heart sounds.   No murmur heard.  No edema. Pulmonary/Chest: Effort normal and breath sounds normal. No respiratory distress. She has no wheezes. She has no rales.  Breast: deferred   Abdominal: Soft. She exhibits no distension. There is no tenderness.  Lymphadenopathy: She has no cervical adenopathy.  Skin: Skin is warm and dry.  She is not diaphoretic.  Psychiatric: She has a normal mood and affect. Her behavior is normal.     Lab Results  Component Value Date   WBC 4.5 03/11/2022   HGB 13.8 03/11/2022   HCT 40.6 03/11/2022   PLT 371.0 03/11/2022   GLUCOSE 88 03/11/2022   CHOL 210 (H) 03/11/2022   TRIG 111.0 03/11/2022   HDL 49.60 03/11/2022   LDLCALC 138 (H) 03/11/2022   ALT 12 03/11/2022   AST 14 03/11/2022   NA 138 03/11/2022   K 3.3 (L) 03/11/2022   CL 98 03/11/2022   CREATININE 0.84 03/11/2022   BUN 13 03/11/2022   CO2 29 03/11/2022   TSH 0.41 03/11/2022         Assessment & Plan:    Physical exam: Screening blood work  ordered Exercise  walking Weight  obese - encouraged weight loss Substance abuse  none   Reviewed recommended immunizations.   Health Maintenance  Topic Date Due   INFLUENZA VACCINE  09/11/2023 (Originally 01/12/2023)   MAMMOGRAM  06/11/2023   DTaP/Tdap/Td (2 - Td or Tdap) 06/04/2025   Cervical Cancer Screening (HPV/Pap Cotest)  05/12/2027   Colonoscopy  09/14/2031   HPV VACCINES  Aged Out   COVID-19 Vaccine  Discontinued   Hepatitis C Screening  Discontinued   HIV Screening  Discontinued          See Problem List for Assessment and Plan of chronic medical problems.

## 2023-03-14 ENCOUNTER — Ambulatory Visit (INDEPENDENT_AMBULATORY_CARE_PROVIDER_SITE_OTHER): Payer: BC Managed Care – PPO | Admitting: Internal Medicine

## 2023-03-14 VITALS — BP 128/74 | HR 60 | Temp 98.0°F | Ht 63.0 in | Wt 187.0 lb

## 2023-03-14 DIAGNOSIS — E7849 Other hyperlipidemia: Secondary | ICD-10-CM | POA: Diagnosis not present

## 2023-03-14 DIAGNOSIS — I10 Essential (primary) hypertension: Secondary | ICD-10-CM

## 2023-03-14 DIAGNOSIS — E876 Hypokalemia: Secondary | ICD-10-CM | POA: Insufficient documentation

## 2023-03-14 DIAGNOSIS — Z0000 Encounter for general adult medical examination without abnormal findings: Secondary | ICD-10-CM

## 2023-03-14 LAB — CBC WITH DIFFERENTIAL/PLATELET
Basophils Absolute: 0.1 10*3/uL (ref 0.0–0.1)
Basophils Relative: 0.9 % (ref 0.0–3.0)
Eosinophils Absolute: 0.2 10*3/uL (ref 0.0–0.7)
Eosinophils Relative: 3.5 % (ref 0.0–5.0)
HCT: 40.5 % (ref 36.0–46.0)
Hemoglobin: 13.5 g/dL (ref 12.0–15.0)
Lymphocytes Relative: 23.6 % (ref 12.0–46.0)
Lymphs Abs: 1.3 10*3/uL (ref 0.7–4.0)
MCHC: 33.3 g/dL (ref 30.0–36.0)
MCV: 94.9 fL (ref 78.0–100.0)
Monocytes Absolute: 0.6 10*3/uL (ref 0.1–1.0)
Monocytes Relative: 11.3 % (ref 3.0–12.0)
Neutro Abs: 3.5 10*3/uL (ref 1.4–7.7)
Neutrophils Relative %: 60.7 % (ref 43.0–77.0)
Platelets: 357 10*3/uL (ref 150.0–400.0)
RBC: 4.26 Mil/uL (ref 3.87–5.11)
RDW: 14 % (ref 11.5–15.5)
WBC: 5.7 10*3/uL (ref 4.0–10.5)

## 2023-03-14 LAB — COMPREHENSIVE METABOLIC PANEL
ALT: 15 U/L (ref 0–35)
AST: 23 U/L (ref 0–37)
Albumin: 4.3 g/dL (ref 3.5–5.2)
Alkaline Phosphatase: 57 U/L (ref 39–117)
BUN: 10 mg/dL (ref 6–23)
CO2: 27 meq/L (ref 19–32)
Calcium: 9.5 mg/dL (ref 8.4–10.5)
Chloride: 98 meq/L (ref 96–112)
Creatinine, Ser: 0.78 mg/dL (ref 0.40–1.20)
GFR: 90 mL/min (ref >60.00)
Glucose, Bld: 91 mg/dL (ref 70–99)
Potassium: 3.6 meq/L (ref 3.5–5.1)
Sodium: 135 meq/L (ref 135–145)
Total Bilirubin: 0.3 mg/dL (ref 0.2–1.2)
Total Protein: 7.7 g/dL (ref 6.0–8.3)

## 2023-03-14 LAB — LIPID PANEL
Cholesterol: 197 mg/dL (ref 0–200)
HDL: 53.6 mg/dL (ref >39.00)
LDL Cholesterol: 122 mg/dL — ABNORMAL HIGH (ref 0–99)
NonHDL: 143.33
Total CHOL/HDL Ratio: 4
Triglycerides: 108 mg/dL (ref 0.0–149.0)
VLDL: 21.6 mg/dL (ref 0.0–40.0)

## 2023-03-14 LAB — TSH: TSH: 0.42 u[IU]/mL (ref 0.35–5.50)

## 2023-03-14 MED ORDER — AMLODIPINE BESYLATE 10 MG PO TABS: 10.0000 mg | ORAL_TABLET | Freq: Every day | ORAL | 3 refills | Status: DC

## 2023-03-14 NOTE — Assessment & Plan Note (Signed)
Chronic Blood pressure well controlled CMP, CBC Continue triamterene-hydrochlorothiazide 37.5-25 mg daily, amlodipine 10 mg daily

## 2023-03-14 NOTE — Assessment & Plan Note (Addendum)
Chronic Secondary to diuretic use CMP Continue potassium chloride 20 mEq daily

## 2023-03-14 NOTE — Assessment & Plan Note (Signed)
Chronic °Regular exercise and healthy diet encouraged °Check lipid panel, TSH °Continue lifestyle control °

## 2023-05-30 ENCOUNTER — Ambulatory Visit: Payer: BC Managed Care – PPO | Admitting: Obstetrics and Gynecology

## 2023-11-09 ENCOUNTER — Encounter: Payer: Self-pay | Admitting: Internal Medicine

## 2023-11-09 NOTE — Progress Notes (Unsigned)
    Subjective:    Patient ID: KINLEE GARRISON, female    DOB: [birth year], 48 y.o.   MRN: [id number]      HPI Tatia is here for No chief complaint on file.   Left hand pain, shooting and throbbing pain in left thumb     Medications and allergies reviewed with patient and updated if appropriate.  Current Outpatient Medications on File Prior to Visit  Medication Sig Dispense Refill   amLODipine  (NORVASC ) 10 MG tablet Take 1 tablet (10 mg total) by mouth daily. 90 tablet 3   BIOTIN PO Take by mouth daily. TAKE ONE CAPSULE THRE TIMES DAILY     Cholecalciferol (VITAMIN D3 PO) Take by mouth daily. TAKE ONE GEL PILL DAILY     Multiple Vitamin (MULTIVITAMIN PO) Take by mouth 2 (two) times daily. Take one in the A.M.,one in the P.M.     OVER THE COUNTER MEDICATION 3 (three) times daily. CRANBERRY WITH MANNOSE TAKE ONE CAPSULE DAILY     OVER THE COUNTER MEDICATION 3 (three) times daily. PROBOTIC TAKE ONE TABLET THREE TIMES A DAY     potassium chloride  (KLOR-CON  M) 10 MEQ tablet Take 2 tablets (20 mEq total) by mouth daily. 180 tablet 3   Pyridoxine HCl (VITAMIN B-6 PO) Take by mouth daily. TAKE ONE TABLET     triamterene -hydrochlorothiazide  (DYAZIDE ) 37.5-25 MG capsule Take 1 each (1 capsule total) by mouth daily. 90 capsule 3   No current facility-administered medications on file prior to visit.    Review of Systems     Objective:  There were no vitals filed for this visit. BP Readings from Last 3 Encounters:  03/14/23 128/74  11/24/22 110/84  05/11/22 134/80   Wt Readings from Last 3 Encounters:  03/14/23 187 lb (84.8 kg)  11/24/22 182 lb (82.6 kg)  05/11/22 186 lb (84.4 kg)   There is no height or weight on file to calculate BMI.    Physical Exam         Assessment & Plan:    See Problem List for Assessment and Plan of chronic medical problems.

## 2023-11-10 ENCOUNTER — Ambulatory Visit (INDEPENDENT_AMBULATORY_CARE_PROVIDER_SITE_OTHER): Payer: Self-pay | Admitting: Internal Medicine

## 2023-11-10 VITALS — BP 130/76 | HR 70 | Temp 98.3°F | Ht 63.0 in | Wt 195.0 lb

## 2023-11-10 DIAGNOSIS — M654 Radial styloid tenosynovitis [de Quervain]: Secondary | ICD-10-CM | POA: Diagnosis not present

## 2023-11-10 DIAGNOSIS — I10 Essential (primary) hypertension: Secondary | ICD-10-CM

## 2023-11-10 NOTE — Patient Instructions (Addendum)
      Medications changes include :   None    A referral was ordered and [facility] someone will call you to schedule an appointment.

## 2023-11-10 NOTE — Assessment & Plan Note (Signed)
 Chronic Blood pressure well controlled Continue triamterene -hydrochlorothiazide  37.5-25 mg daily, amlodipine  10 mg daily

## 2023-11-10 NOTE — Assessment & Plan Note (Signed)
 New Symptoms consistent with de Quervain's tenosynovitis Has had pain for several months-has gotten worse Not improving with conservative treatment with ibuprofen, bracing Referral to orthopedics ordered

## 2023-11-13 ENCOUNTER — Other Ambulatory Visit: Payer: Self-pay

## 2023-11-13 DIAGNOSIS — I10 Essential (primary) hypertension: Secondary | ICD-10-CM

## 2023-11-13 MED ORDER — TRIAMTERENE-HCTZ 37.5-25 MG PO CAPS: 1.0000 | ORAL_CAPSULE | Freq: Every day | ORAL | 3 refills | Status: AC

## 2023-11-27 ENCOUNTER — Other Ambulatory Visit: Payer: Self-pay | Admitting: Internal Medicine

## 2023-11-27 DIAGNOSIS — Z1231 Encounter for screening mammogram for malignant neoplasm of breast: Secondary | ICD-10-CM

## 2023-11-29 ENCOUNTER — Ambulatory Visit
Admission: RE | Admit: 2023-11-29 | Discharge: 2023-11-29 | Discharge status: Home / Self Care | Source: Ambulatory Visit | Attending: Internal Medicine

## 2023-11-29 DIAGNOSIS — Z1231 Encounter for screening mammogram for malignant neoplasm of breast: Secondary | ICD-10-CM

## 2023-12-04 ENCOUNTER — Other Ambulatory Visit: Payer: Self-pay | Admitting: Internal Medicine

## 2023-12-04 DIAGNOSIS — N644 Mastodynia: Secondary | ICD-10-CM

## 2024-01-10 ENCOUNTER — Ambulatory Visit
Admission: RE | Admit: 2024-01-10 | Discharge: 2024-01-10 | Disposition: A | Discharge status: Home / Self Care | Source: Ambulatory Visit | Attending: Internal Medicine

## 2024-01-10 DIAGNOSIS — N644 Mastodynia: Secondary | ICD-10-CM

## 2024-03-19 ENCOUNTER — Other Ambulatory Visit: Payer: Self-pay

## 2024-03-19 MED ORDER — AMLODIPINE BESYLATE 10 MG PO TABS: 10.0000 mg | ORAL_TABLET | Freq: Every day | ORAL | 0 refills | Status: DC

## 2024-03-24 NOTE — Progress Notes (Unsigned)
 Subjective:    Patient ID: Kaylee May, female    DOB: 1975-10-13, 48 y.o.   MRN: [id number]     HPI Kaylee May is here for follow up of her chronic medical problems.  She has a new and her first grand baby  - she is a 3 old.    Does a lot of walking, active with yard work.    Medications and allergies reviewed with patient and updated if appropriate.  Current Outpatient Medications on File Prior to Visit  Medication Sig Dispense Refill   amLODipine  (NORVASC ) 10 MG tablet Take 1 tablet (10 mg total) by mouth daily. 90 tablet 0   BIOTIN PO Take by mouth daily. TAKE ONE CAPSULE THRE TIMES DAILY     Cholecalciferol (VITAMIN D3 PO) Take by mouth daily. TAKE ONE GEL PILL DAILY     Multiple Vitamin (MULTIVITAMIN PO) Take by mouth 2 (two) times daily. Take one in the A.M.,one in the P.M.     OVER THE COUNTER MEDICATION 3 (three) times daily. CRANBERRY WITH MANNOSE TAKE ONE CAPSULE DAILY     OVER THE COUNTER MEDICATION 3 (three) times daily. PROBOTIC TAKE ONE TABLET THREE TIMES A DAY     potassium chloride  (KLOR-CON  M) 10 MEQ tablet Take 2 tablets (20 mEq total) by mouth daily. 180 tablet 3   Pyridoxine HCl (VITAMIN B-6 PO) Take by mouth daily. TAKE ONE TABLET     triamterene -hydrochlorothiazide  (DYAZIDE ) 37.5-25 MG capsule Take 1 each (1 capsule total) by mouth daily. 90 capsule 3   No current facility-administered medications on file prior to visit.     Review of Systems  Constitutional:  Negative for fever.  Respiratory:  Negative for cough, shortness of breath and wheezing.   Cardiovascular:  Negative for chest pain, palpitations and leg swelling.  Neurological:  Negative for light-headedness and headaches.       Objective:   Vitals:   03/25/24 0952  BP: 128/70  Pulse: 79  Temp: 98.2 F (36.8 C)  SpO2: 99%   BP Readings from Last 3 Encounters:  03/25/24 128/70  11/10/23 130/76  03/14/23 128/74   Wt Readings from Last 3 Encounters:  03/25/24 190 lb (86.2  kg)  11/10/23 195 lb (88.5 kg)  03/14/23 187 lb (84.8 kg)   Body mass index is 33.66 kg/m.    Physical Exam Constitutional:      General: She is not in acute distress.    Appearance: Normal appearance.  HENT:     Head: Normocephalic and atraumatic.  Eyes:     Conjunctiva/sclera: Conjunctivae normal.  Cardiovascular:     Rate and Rhythm: Normal rate and regular rhythm.     Heart sounds: Normal heart sounds.  Pulmonary:     Effort: Pulmonary effort is normal. No respiratory distress.     Breath sounds: Normal breath sounds. No wheezing.  Musculoskeletal:     Cervical back: Neck supple.     Right lower leg: No edema.     Left lower leg: No edema.  Lymphadenopathy:     Cervical: No cervical adenopathy.  Skin:    General: Skin is warm and dry.     Findings: No rash.  Neurological:     Mental Status: She is alert. Mental status is at baseline.  Psychiatric:        Mood and Affect: Mood normal.        Behavior: Behavior normal.        Lab Results  Component Value Date   WBC 5.7 03/14/2023   HGB 13.5 03/14/2023   HCT 40.5 03/14/2023   PLT 357.0 03/14/2023   GLUCOSE 91 03/14/2023   CHOL 197 03/14/2023   TRIG 108.0 03/14/2023   HDL 53.60 03/14/2023   LDLCALC 122 (H) 03/14/2023   ALT 15 03/14/2023   AST 23 03/14/2023   NA 135 03/14/2023   K 3.6 03/14/2023   CL 98 03/14/2023   CREATININE 0.78 03/14/2023   BUN 10 03/14/2023   CO2 27 03/14/2023   TSH 0.42 03/14/2023     Assessment & Plan:    See Problem List for Assessment and Plan of chronic medical problems.

## 2024-03-24 NOTE — Patient Instructions (Addendum)
      Blood work was ordered.       Medications changes include :   None    A referral was ordered and someone will call you to schedule an appointment.     Return in about 6 months (around 09/23/2024) for Physical Exam.

## 2024-03-25 ENCOUNTER — Encounter: Payer: Self-pay | Admitting: Internal Medicine

## 2024-03-25 ENCOUNTER — Ambulatory Visit (INDEPENDENT_AMBULATORY_CARE_PROVIDER_SITE_OTHER): Admitting: Internal Medicine

## 2024-03-25 VITALS — BP 128/70 | HR 79 | Temp 98.2°F | Ht 63.0 in | Wt 190.0 lb

## 2024-03-25 DIAGNOSIS — E876 Hypokalemia: Secondary | ICD-10-CM | POA: Diagnosis not present

## 2024-03-25 DIAGNOSIS — I10 Essential (primary) hypertension: Secondary | ICD-10-CM | POA: Diagnosis not present

## 2024-03-25 DIAGNOSIS — E7800 Pure hypercholesterolemia, unspecified: Secondary | ICD-10-CM

## 2024-03-25 LAB — LIPID PANEL
Cholesterol: 205 mg/dL — ABNORMAL HIGH (ref 0–200)
HDL: 45 mg/dL (ref >39.00)
LDL Cholesterol: 138 mg/dL — ABNORMAL HIGH (ref 0–99)
NonHDL: 160.4
Total CHOL/HDL Ratio: 5
Triglycerides: 114 mg/dL (ref 0.0–149.0)
VLDL: 22.8 mg/dL (ref 0.0–40.0)

## 2024-03-25 LAB — COMPREHENSIVE METABOLIC PANEL WITH GFR
ALT: 12 U/L (ref 0–35)
AST: 16 U/L (ref 0–37)
Albumin: 4.4 g/dL (ref 3.5–5.2)
Alkaline Phosphatase: 74 U/L (ref 39–117)
BUN: 11 mg/dL (ref 6–23)
CO2: 32 meq/L (ref 19–32)
Calcium: 9.7 mg/dL (ref 8.4–10.5)
Chloride: 100 meq/L (ref 96–112)
Creatinine, Ser: 0.86 mg/dL (ref 0.40–1.20)
GFR: 79.47 mL/min (ref >60.00)
Glucose, Bld: 91 mg/dL (ref 70–99)
Potassium: 3.3 meq/L — ABNORMAL LOW (ref 3.5–5.1)
Sodium: 140 meq/L (ref 135–145)
Total Bilirubin: 0.3 mg/dL (ref 0.2–1.2)
Total Protein: 7.7 g/dL (ref 6.0–8.3)

## 2024-03-25 NOTE — Assessment & Plan Note (Signed)
Chronic Regular exercise and healthy diet encouraged Check lipid panel, CMP Continue lifestyle control

## 2024-03-25 NOTE — Assessment & Plan Note (Signed)
Chronic Secondary to diuretic use CMP Continue potassium chloride 20 mEq daily

## 2024-03-25 NOTE — Assessment & Plan Note (Signed)
 Chronic Blood pressure well controlled Continue triamterene -hydrochlorothiazide  37.5-25 mg daily, amlodipine  10 mg daily CMP

## 2024-03-27 ENCOUNTER — Ambulatory Visit: Admitting: Internal Medicine

## 2024-03-28 ENCOUNTER — Ambulatory Visit: Payer: Self-pay | Admitting: Internal Medicine

## 2024-03-28 MED ORDER — POTASSIUM CHLORIDE CRYS ER 10 MEQ PO TBCR: 30.0000 meq | EXTENDED_RELEASE_TABLET | Freq: Every day | ORAL | 3 refills | Status: AC

## 2024-06-26 ENCOUNTER — Other Ambulatory Visit: Payer: Self-pay | Admitting: Internal Medicine

## 2024-09-24 ENCOUNTER — Encounter: Admitting: Internal Medicine
